# Patient Record
Sex: Male | Born: 1960 | Race: Black or African American | Hispanic: No | Marital: Married | Smoking: Never smoker
Health system: Southern US, Community
[De-identification: ages and names within clinical notes are randomized; demographics above are authoritative.]

## PROBLEM LIST (undated history)

## (undated) DIAGNOSIS — Z95810 Presence of automatic (implantable) cardiac defibrillator: Secondary | ICD-10-CM

## (undated) DIAGNOSIS — K219 Gastro-esophageal reflux disease without esophagitis: Secondary | ICD-10-CM

## (undated) DIAGNOSIS — I5022 Chronic systolic (congestive) heart failure: Secondary | ICD-10-CM

## (undated) DIAGNOSIS — F419 Anxiety disorder, unspecified: Secondary | ICD-10-CM

## (undated) DIAGNOSIS — R319 Hematuria, unspecified: Secondary | ICD-10-CM

## (undated) DIAGNOSIS — I5042 Chronic combined systolic (congestive) and diastolic (congestive) heart failure: Secondary | ICD-10-CM

## (undated) DIAGNOSIS — R011 Cardiac murmur, unspecified: Secondary | ICD-10-CM

## (undated) DIAGNOSIS — I447 Left bundle-branch block, unspecified: Secondary | ICD-10-CM

## (undated) DIAGNOSIS — G4700 Insomnia, unspecified: Secondary | ICD-10-CM

## (undated) DIAGNOSIS — I428 Other cardiomyopathies: Secondary | ICD-10-CM

## (undated) DIAGNOSIS — I10 Essential (primary) hypertension: Secondary | ICD-10-CM

## (undated) HISTORY — DX: Other cardiomyopathies: I42.8

## (undated) HISTORY — DX: Hematuria, unspecified: R31.9

## (undated) HISTORY — DX: Chronic combined systolic (congestive) and diastolic (congestive) heart failure: I50.42

## (undated) HISTORY — DX: Essential (primary) hypertension: I10

## (undated) HISTORY — DX: Presence of automatic (implantable) cardiac defibrillator: Z95.810

## (undated) HISTORY — DX: Left bundle-branch block, unspecified: I44.7

---

## 2008-01-13 ENCOUNTER — Inpatient Hospital Stay (HOSPITAL_BASED_OUTPATIENT_CLINIC_OR_DEPARTMENT_OTHER): Admission: RE | Admit: 2008-01-13 | Discharge: 2008-01-13 | Payer: Self-pay | Admitting: Interventional Cardiology

## 2008-01-13 HISTORY — PX: CARDIAC CATHETERIZATION: SHX172

## 2008-01-16 ENCOUNTER — Encounter: Admission: RE | Admit: 2008-01-16 | Discharge: 2008-01-16 | Payer: Self-pay | Admitting: Interventional Cardiology

## 2011-01-27 NOTE — Cardiovascular Report (Signed)
Jeremy Wagner, Jeremy Wagner                 ACCOUNT NO.:  [id number]   MEDICAL RECORD NO.:  [id number]          PATIENT TYPE:  OIB   LOCATION:  1965                         FACILITY:  [facility]   PHYSICIAN:  Lyn Records, M.D.   DATE OF BIRTH:  1960-10-17   DATE OF PROCEDURE:  01/13/2008  DATE OF DISCHARGE:  01/13/2008                            CARDIAC CATHETERIZATION   INDICATION:  The patient has unexplained left ventricular dysfunction.  A recent Cardiolite study demonstrated perfusion abnormalities.  Study  is being done to exclude coronary artery disease as a source of the  patient's myocardial dysfunction.   PROCEDURES PERFORMED:  1. Left heart catheterization.  2. Selective coronary arteriography.  3. Coronary angiography.   DESCRIPTION:  After informed consent, the patient was brought to the  catheterization laboratory in the postabsorptive state.  He was given 1  mg of IV Versed and 25 mcg of fentanyl IV for sedation.  A 1% Xylocaine  was used for local infiltration in the right femoral region.  A 4-French  sheath was then placed using the modified Seldinger technique following  a single anterior wall stick.  A 4-French A2 multipurpose catheter was  then used for hemodynamic recordings, left ventriculography by hand  injection, and selective right coronary angiography.  We then removed  this catheter and performed left coronary angiography with a #4-French  left Judkins catheter.  The patient tolerated the procedure without  complications.  Manual compression was used to achieve hemostasis.  No  complications occurred.   RESULTS:  1. Hemodynamic data:      a.     Aortic pressure 103/64.      b.     Left ventricular pressure 105/14.  2. Left ventriculography:  The left ventricle appears to be mildly      dilated.  There is global left ventricular hypokinesis.  The      ejection fraction is 35-40%.  No mitral regurgitation is noted.  No      segmental wall motion abnormality is  seen.  There is global      hypokinesis.  3. Coronary angiography:      a.     Left main coronary:  Normal.      b.     Left anterior descending coronary:  Left anterior descending       gives origin to 4 diagonal branches.  The entire left anterior       descending including its diagonals is entirely normal.  The LAD       wraps around the left ventricular apex.      c.     Circumflex artery:  The circumflex coronary artery is large.       It gives origin to 2 predominant obtuse marginal branches.  It is       free of any obstruction.      d.     Right coronary:  The right coronary artery is dominant and       is normal.  It gives origin to PDA and AV-nodal artery.   CONCLUSIONS:  1. Left  ventricular dysfunction with ejection fraction in 35-40%      range.  2. Normal coronary arteries.  3. 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   PLAN:  Aggressive medical therapy partly to include angiotensin-renin  system blockade and autonomic nervous system blockade.  Diuretic therapy  does not appear to be indicated at this time.  We should maximize ACE  and beta-blocker therapy.      Lyn Records, M.D.  Electronically Signed     HWS/MEDQ  D:  01/13/2008  T:  01/13/2008  Job:  [id number]   cc:   Reuben Likes, M.D.

## 2011-10-27 ENCOUNTER — Other Ambulatory Visit: Payer: Self-pay | Admitting: Internal Medicine

## 2011-10-27 DIAGNOSIS — R609 Edema, unspecified: Secondary | ICD-10-CM

## 2011-10-28 ENCOUNTER — Ambulatory Visit
Admission: RE | Admit: 2011-10-28 | Discharge: 2011-10-28 | Disposition: A | Discharge status: Home / Self Care | Payer: BC Managed Care – PPO | Source: Ambulatory Visit | Attending: Internal Medicine | Admitting: Internal Medicine

## 2011-10-28 DIAGNOSIS — R609 Edema, unspecified: Secondary | ICD-10-CM

## 2014-01-17 ENCOUNTER — Encounter: Payer: Self-pay | Admitting: Interventional Cardiology

## 2014-01-24 ENCOUNTER — Telehealth: Payer: Self-pay | Admitting: Interventional Cardiology

## 2014-01-24 NOTE — Telephone Encounter (Signed)
New message           Pt would like to get dr [name] opinion about a surgery before he schedules it for next week

## 2014-01-25 NOTE — Telephone Encounter (Signed)
lmom.Dr.Smithis out of the office until Monday 5/18.pt can return call to discuss further.

## 2014-01-25 NOTE — Telephone Encounter (Signed)
pt sts that he is scheduled to have a colonoscipy on 5/20 with Dr.Hung.pt sts that he is wanting to have Dr.Smith receommendations on being put to sleep.pt is quite anxious.Dr.Hung will be doing the procedure.adv pt that Dr.Smith will return to the office on Monday.pt sts that Dr.Hung office is wanting add info.adv pt I would call their office to see what is needed and call him back with info.pt verbalized understanding.

## 2014-01-25 NOTE — Telephone Encounter (Signed)
per pt rqst.called to see what add info was needed for pt upcoming colonoscopy on 5/20 lmom for Dr.Hung's nurse to call back

## 2014-01-30 NOTE — Telephone Encounter (Signed)
lmtcb on patient's mobile number.

## 2014-01-30 NOTE — Telephone Encounter (Signed)
Spoke to Dr. Michaelle Copas primary CMA to inform.

## 2014-01-30 NOTE — Telephone Encounter (Signed)
New message   Office spoke with patient on the phone .   Colonoscopy is cancel due to patient complaint of chest pain at rest.    patient stated he called the office the make an appt .   Office is aware of message from patient on  5/13.

## 2014-01-30 NOTE — Telephone Encounter (Signed)
New problem   Pt need to speak to nurse concerning a procedure he is needing to have done 02/01/14. Please call pt.

## 2014-01-31 NOTE — Telephone Encounter (Signed)
2nd attempt lmom for pt to callback 

## 2014-01-31 NOTE — Telephone Encounter (Signed)
Follow Up: ° °Pt is returning Lisa's call.  °

## 2014-01-31 NOTE — Telephone Encounter (Signed)
returned pt call.lmom that Dr.Smith was ok with pt having the colonoscopy with general anesthesia.per Dr.Hung office pt cancelled appt because he was having chest pain at rest.left message for pt to cal back if he is still having chest discomfort

## 2014-03-08 ENCOUNTER — Ambulatory Visit: Payer: BC Managed Care – PPO | Admitting: Interventional Cardiology

## 2014-04-10 ENCOUNTER — Telehealth: Payer: Self-pay | Admitting: Interventional Cardiology

## 2014-04-10 NOTE — Telephone Encounter (Signed)
New problem   Pt need medication refill and Mindy stated pt hasn't been seen in office and message need to go to nurse. Please call pt.

## 2014-04-10 NOTE — Telephone Encounter (Signed)
F/u    Pt need his meds called in because he doesn't have any. Please call pt or call pharmacy [facility], Carvedilol 6.25mg .

## 2014-04-11 ENCOUNTER — Other Ambulatory Visit: Payer: Self-pay

## 2014-04-11 MED ORDER — CARVEDILOL 6.25 MG PO TABS: 6.2500 mg | ORAL_TABLET | Freq: Two times a day (BID) | ORAL | Status: DC

## 2014-04-11 NOTE — Telephone Encounter (Signed)
Cardvedilol 6.25mg  bid refilled

## 2014-04-16 ENCOUNTER — Encounter: Payer: Self-pay | Admitting: General Surgery

## 2014-04-16 ENCOUNTER — Encounter: Payer: Self-pay | Admitting: Interventional Cardiology

## 2014-04-16 ENCOUNTER — Ambulatory Visit (INDEPENDENT_AMBULATORY_CARE_PROVIDER_SITE_OTHER): Payer: BC Managed Care – PPO | Admitting: Interventional Cardiology

## 2014-04-16 VITALS — BP 137/85 | HR 60 | Ht 69.0 in | Wt 165.4 lb

## 2014-04-16 DIAGNOSIS — R0789 Other chest pain: Secondary | ICD-10-CM

## 2014-04-16 DIAGNOSIS — R0782 Intercostal pain: Secondary | ICD-10-CM

## 2014-04-16 DIAGNOSIS — I130 Hypertensive heart and chronic kidney disease with heart failure and stage 1 through stage 4 chronic kidney disease, or unspecified chronic kidney disease: Secondary | ICD-10-CM | POA: Insufficient documentation

## 2014-04-16 DIAGNOSIS — I5032 Chronic diastolic (congestive) heart failure: Secondary | ICD-10-CM

## 2014-04-16 DIAGNOSIS — I428 Other cardiomyopathies: Secondary | ICD-10-CM

## 2014-04-16 DIAGNOSIS — I10 Essential (primary) hypertension: Secondary | ICD-10-CM

## 2014-04-16 DIAGNOSIS — I447 Left bundle-branch block, unspecified: Secondary | ICD-10-CM | POA: Insufficient documentation

## 2014-04-16 DIAGNOSIS — I5042 Chronic combined systolic (congestive) and diastolic (congestive) heart failure: Secondary | ICD-10-CM | POA: Insufficient documentation

## 2014-04-16 NOTE — Patient Instructions (Addendum)
Your physician recommends that you continue on your current medications as directed. Please refer to the Current Medication list given to you today. Restart Lisinopril 5 Mg daily  Continue with Low sodium Diet  Your physician wants you to follow-up in: 1 year with Dr Marlou Starks will receive a reminder letter in the mail two months in advance. If you don't receive a letter, please call our office to schedule the follow-up appointment.

## 2014-04-16 NOTE — Progress Notes (Signed)
Patient ID: DAT BJORKMAN, male   DOB: May 20, 1961, 53 y.o.   MRN: [id number]    [address] [place], [place]  [address] Phone: [phone] Fax:  [phone]  Date:  04/16/2014   ID:  HASSELL BERNAL, DOB June 08, 1961, MRN [id number]  PCP:  Georgann Housekeeper, MD   ASSESSMENT:  1. Hypertension, controlled 2. Left bundle branch block 3. Chronic combined systolic and diastolic heart failure 4. Lower left and right subcostal pain felt to represent musculoskeletal discomfort  PLAN:  1. Resume lisinopril which the patient has stopped taking 2. Low salt diet 3. No specific cardiac workup at this time to consider 2-D Doppler echocardiogram on return in one year 4. It is okay to proceed with colonoscopy to   SUBJECTIVE: Yael B Stockley is a 53 y.o. male who is asymptomatic. 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 His weight is been stable. He denies orthopnea and PND. For some reason he has not taken lisinopril for a week. He has mild his blood pressure and states that the systolic is less than 120 when he measures it. Greater than a year ago we decreased lisinopril to 5 mg per day because of hypotension. He denies dizziness and lightheadedness as a reason for a voiding lisinopril. He denies cough. He canceled a recent colonoscopy because of subcostal discomfort. This is been a chronic complaint over many years. It is nonexertional and it has not changed over that time frame.  Wt Readings from Last 3 Encounters:  04/16/14 165 lb 6.4 oz (75.025 kg)     Past Medical History  Diagnosis Date  . Nonischemic cardiomyopathy     LVEF 35-40% by cath 01-13-08. Normal coronary arteries  . Hypertension   . LBBB (left bundle branch block)   . Chronic diastolic heart failure   . Reflux   . Hematuria     Current Outpatient Prescriptions  Medication Sig Dispense Refill  . carvedilol (COREG) 6.25 MG tablet Take 1 tablet (6.25 mg total) by mouth 2 (two) times daily.  60 tablet  0  .  lisinopril (PRINIVIL,ZESTRIL) 10 MG tablet Take 5 mg by mouth daily.       No current facility-administered medications for this visit.    Allergies:   No Known Allergies  Social History:  The patient  reports that he has never smoked. He does not have any smokeless tobacco history on file. He reports that he does not drink alcohol or use illicit drugs.   ROS:  Please see the history of present illness.   No edema or claudication. Denies PND and orthopnea. No palpitations or neurological complaint   All other systems reviewed and negative.   OBJECTIVE: VS:  BP 137/85  Pulse 60  Ht 5\' 9"  (1.753 m)  Wt 165 lb 6.4 oz (75.025 kg)  BMI 24.41 kg/m2 Well nourished, well developed, in no acute distress, appears than stated a HEENT: normal Neck: JVD flat. Carotid bruit absent  Cardiac:  normal S1, S2; RRR; no murmur. An S4 gallop is on Lungs:  clear to auscultation bilaterally, no wheezing, rhonchi or rales Abd: soft, nontender, no hepatomegaly Ext: Edema absent. Pulses 2+ and symmetric Skin: warm and dry Neuro:  CNs 2-12 intact, no focal abnormalities noted  EKG:  Normal sinus rhythm with left bundle branch block and normal PR interval, unchanged from prior. QRS duration 156 ms       Signed, Darci Needle [name], MD 04/16/2014 10:28 AM

## 2014-05-23 ENCOUNTER — Other Ambulatory Visit: Payer: Self-pay

## 2014-05-23 MED ORDER — LISINOPRIL 10 MG PO TABS: 5.0000 mg | ORAL_TABLET | Freq: Every day | ORAL | Status: DC

## 2014-06-18 ENCOUNTER — Other Ambulatory Visit: Payer: Self-pay | Admitting: Interventional Cardiology

## 2014-10-09 ENCOUNTER — Encounter (HOSPITAL_COMMUNITY): Payer: Self-pay | Admitting: Emergency Medicine

## 2014-10-09 ENCOUNTER — Observation Stay (HOSPITAL_COMMUNITY)
Admission: EM | Admit: 2014-10-09 | Discharge: 2014-10-10 | Disposition: A | Discharge status: Home / Self Care | Payer: BC Managed Care – PPO | Attending: Internal Medicine | Admitting: Internal Medicine

## 2014-10-09 ENCOUNTER — Emergency Department (HOSPITAL_COMMUNITY): Payer: BC Managed Care – PPO

## 2014-10-09 ENCOUNTER — Telehealth: Payer: Self-pay | Admitting: Interventional Cardiology

## 2014-10-09 DIAGNOSIS — I428 Other cardiomyopathies: Secondary | ICD-10-CM

## 2014-10-09 DIAGNOSIS — I429 Cardiomyopathy, unspecified: Secondary | ICD-10-CM | POA: Diagnosis not present

## 2014-10-09 DIAGNOSIS — K219 Gastro-esophageal reflux disease without esophagitis: Secondary | ICD-10-CM | POA: Insufficient documentation

## 2014-10-09 DIAGNOSIS — I447 Left bundle-branch block, unspecified: Secondary | ICD-10-CM | POA: Diagnosis not present

## 2014-10-09 DIAGNOSIS — Z7982 Long term (current) use of aspirin: Secondary | ICD-10-CM | POA: Diagnosis not present

## 2014-10-09 DIAGNOSIS — R001 Bradycardia, unspecified: Secondary | ICD-10-CM | POA: Insufficient documentation

## 2014-10-09 DIAGNOSIS — F419 Anxiety disorder, unspecified: Secondary | ICD-10-CM | POA: Diagnosis not present

## 2014-10-09 DIAGNOSIS — R079 Chest pain, unspecified: Principal | ICD-10-CM | POA: Diagnosis present

## 2014-10-09 DIAGNOSIS — Z9119 Patient's noncompliance with other medical treatment and regimen: Secondary | ICD-10-CM | POA: Insufficient documentation

## 2014-10-09 DIAGNOSIS — I10 Essential (primary) hypertension: Secondary | ICD-10-CM | POA: Insufficient documentation

## 2014-10-09 DIAGNOSIS — I130 Hypertensive heart and chronic kidney disease with heart failure and stage 1 through stage 4 chronic kidney disease, or unspecified chronic kidney disease: Secondary | ICD-10-CM | POA: Diagnosis present

## 2014-10-09 DIAGNOSIS — I5032 Chronic diastolic (congestive) heart failure: Secondary | ICD-10-CM

## 2014-10-09 DIAGNOSIS — I5042 Chronic combined systolic (congestive) and diastolic (congestive) heart failure: Secondary | ICD-10-CM | POA: Diagnosis not present

## 2014-10-09 DIAGNOSIS — G4700 Insomnia, unspecified: Secondary | ICD-10-CM | POA: Diagnosis not present

## 2014-10-09 DIAGNOSIS — I5022 Chronic systolic (congestive) heart failure: Secondary | ICD-10-CM | POA: Insufficient documentation

## 2014-10-09 DIAGNOSIS — R319 Hematuria, unspecified: Secondary | ICD-10-CM

## 2014-10-09 DIAGNOSIS — R072 Precordial pain: Secondary | ICD-10-CM

## 2014-10-09 DIAGNOSIS — IMO0001 Reserved for inherently not codable concepts without codable children: Secondary | ICD-10-CM | POA: Insufficient documentation

## 2014-10-09 HISTORY — DX: Insomnia, unspecified: G47.00

## 2014-10-09 HISTORY — DX: Anxiety disorder, unspecified: F41.9

## 2014-10-09 LAB — CBC
HCT: 39.7 % (ref 39.0–52.0)
Hemoglobin: 13.2 g/dL (ref 13.0–17.0)
MCH: 31.6 pg (ref 26.0–34.0)
MCHC: 33.2 g/dL (ref 30.0–36.0)
MCV: 95 fL (ref 78.0–100.0)
Platelets: 184 10*3/uL (ref 150–400)
RBC: 4.18 MIL/uL — ABNORMAL LOW (ref 4.22–5.81)
RDW: 11.7 % (ref 11.5–15.5)
WBC: 6.5 10*3/uL (ref 4.0–10.5)

## 2014-10-09 LAB — CBC WITH DIFFERENTIAL/PLATELET
Basophils Absolute: 0 10*3/uL (ref 0.0–0.1)
Basophils Relative: 0 % (ref 0–1)
Eosinophils Absolute: 0.1 10*3/uL (ref 0.0–0.7)
Eosinophils Relative: 2 % (ref 0–5)
HCT: 41.4 % (ref 39.0–52.0)
Hemoglobin: 14 g/dL (ref 13.0–17.0)
Lymphocytes Relative: 25 % (ref 12–46)
Lymphs Abs: 1.8 10*3/uL (ref 0.7–4.0)
MCH: 32 pg (ref 26.0–34.0)
MCHC: 33.8 g/dL (ref 30.0–36.0)
MCV: 94.5 fL (ref 78.0–100.0)
Monocytes Absolute: 0.6 10*3/uL (ref 0.1–1.0)
Monocytes Relative: 8 % (ref 3–12)
Neutro Abs: 4.8 10*3/uL (ref 1.7–7.7)
Neutrophils Relative %: 65 % (ref 43–77)
Platelets: 190 10*3/uL (ref 150–400)
RBC: 4.38 MIL/uL (ref 4.22–5.81)
RDW: 11.6 % (ref 11.5–15.5)
WBC: 7.4 10*3/uL (ref 4.0–10.5)

## 2014-10-09 LAB — COMPREHENSIVE METABOLIC PANEL
ALT: 23 U/L (ref 0–53)
AST: 29 U/L (ref 0–37)
Albumin: 3.9 g/dL (ref 3.5–5.2)
Alkaline Phosphatase: 67 U/L (ref 39–117)
Anion gap: 7 (ref 5–15)
BUN: 11 mg/dL (ref 6–23)
CO2: 28 mmol/L (ref 19–32)
Calcium: 9.3 mg/dL (ref 8.4–10.5)
Chloride: 102 mmol/L (ref 96–112)
Creatinine, Ser: 1.11 mg/dL (ref 0.50–1.35)
GFR calc Af Amer: 85 mL/min — ABNORMAL LOW (ref >90)
GFR calc non Af Amer: 74 mL/min — ABNORMAL LOW (ref >90)
Glucose, Bld: 99 mg/dL (ref 70–99)
Potassium: 4.1 mmol/L (ref 3.5–5.1)
Sodium: 137 mmol/L (ref 135–145)
Total Bilirubin: 1.1 mg/dL (ref 0.3–1.2)
Total Protein: 7.5 g/dL (ref 6.0–8.3)

## 2014-10-09 LAB — TROPONIN I
Troponin I: <0.03 ng/mL (ref <0.031)
Troponin I: <0.03 ng/mL (ref <0.031)
Troponin I: <0.03 ng/mL (ref <0.031)

## 2014-10-09 LAB — CREATININE, SERUM
Creatinine, Ser: 1.02 mg/dL (ref 0.50–1.35)
GFR calc Af Amer: >90 mL/min (ref >90)
GFR calc non Af Amer: 81 mL/min — ABNORMAL LOW (ref >90)

## 2014-10-09 MED ORDER — ASPIRIN EC 325 MG PO TBEC
325.0000 mg | DELAYED_RELEASE_TABLET | Freq: Every day | ORAL | Status: DC
  Administered 2014-10-10: 325 mg via ORAL
  Filled 2014-10-09: qty 1

## 2014-10-09 MED ORDER — ENOXAPARIN SODIUM 40 MG/0.4ML ~~LOC~~ SOLN
40.0000 mg | SUBCUTANEOUS | Status: DC
  Administered 2014-10-09: 40 mg via SUBCUTANEOUS
  Filled 2014-10-09 (×2): qty 0.4

## 2014-10-09 MED ORDER — NITROGLYCERIN 0.4 MG SL SUBL
0.4000 mg | SUBLINGUAL_TABLET | SUBLINGUAL | Status: DC | PRN
  Administered 2014-10-09: 0.4 mg via SUBLINGUAL
  Filled 2014-10-09: qty 1

## 2014-10-09 MED ORDER — CARVEDILOL 3.125 MG PO TABS
3.1250 mg | ORAL_TABLET | Freq: Two times a day (BID) | ORAL | Status: DC
  Administered 2014-10-10 (×2): 3.125 mg via ORAL
  Filled 2014-10-09 (×4): qty 1

## 2014-10-09 MED ORDER — ACETAMINOPHEN 325 MG PO TABS: 650.0000 mg | ORAL_TABLET | ORAL | Status: DC | PRN

## 2014-10-09 MED ORDER — ONDANSETRON HCL 4 MG/2ML IJ SOLN: 4.0000 mg | Freq: Four times a day (QID) | INTRAMUSCULAR | Status: DC | PRN

## 2014-10-09 NOTE — Consult Note (Addendum)
CARDIOLOGY CONSULTATION NOTE.  NAME:  Jeremy Wagner   MRN: [id number] DOB:  15-Oct-1960   ADMIT DATE: 10/09/2014  Reason for Consult: Acute onset Chest Pain (prior h/o NICM)  Requesting Physician: Dr. Mahala Menghini  Primary Cardiologist: Dr. Sherilyn Cooter (Erskine Emery) [name], [name]  HPI: This is a 54 y.o. male with a past medical history significant for NICM (angiographically normal coronaries with EF ~35-40% by cath in 01/2008). He was seen last fall by Dr. [name], he is noted to have a history of medical nonadherence. He is supposed to be on low-dose carvedilol and low-dose ACE inhibitor, but he has not been taking his carvedilol routinely nor his ACE inhibitor. 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 He was feeling tired and fatigued. Feeling overworked  He has been drinking increasing amounts of coffee as well. Besides the sense of stress and fatigue from hard work, he had been doing fine until this morning, shortly after having his coffee he noted a sense of uneasiness with nausea. This was in conjunction with a general feeling of worsening fatigue with a heaviness across his chest. It lasted about 10 minutes at a significant level of roughly 7-8/10. He did take some chewable aspirin this morning and noticed some relief. He did not really get rapid relief from sublingual nitroglycerin given by EMS or in the ER. He was noted to be hypertensive when checked at work: Blood pressure 177/90. It did go down after nitroglycerin. His wife is extremely concerned, as was his boss and coworkers. She insisted that he call Dr. Michaelle Copas office.  He was appropriately referred to present to the emergency room for evaluation. He is being admitted by the  [facility] with cardiology consultation. An echocardiogram has been ordered, but not yet done.  Past Medical History    Diagnosis Date  . Nonischemic cardiomyopathy     LVEF 35-40% by cath 01-13-08. Normal coronary arteries  . Hypertension   . LBBB (left bundle branch block)   . Chronic diastolic heart failure   . Reflux   . Hematuria   . Anxiety   . Insomnia    Past Surgical History  Procedure Laterality Date  . Cardiac catheterization  01/2008    clean Coronaries, HTN CM??; EF ~30-35%    FAMHx: Family History  Problem Relation Age of Onset  . CAD Maternal Aunt 50  . Coronary artery disease Paternal Uncle 46    SOCHx:  reports that he has never smoked. He does not have any smokeless tobacco history on file. He reports that he does not drink alcohol or use illicit drugs.  ALLERGIES: No Known Allergies  ROS: A comprehensive review of systems was performed. Review of Systems  Constitutional: Negative for fever, chills and malaise/fatigue.  HENT: Negative for congestion and nosebleeds.   Eyes: Negative for blurred vision and double vision.  Respiratory: Negative for cough and shortness of breath.   Cardiovascular: Positive for chest pain. Negative for palpitations, orthopnea, claudication, leg swelling and PND.  Gastrointestinal: Negative for blood in stool and melena.  Genitourinary: Negative for hematuria.  Musculoskeletal:       Mild routine aches and pains  Neurological: Positive for dizziness (tthis morning  with high pressures) and headaches (This morning). Negative for focal weakness.  Psychiatric/Behavioral:       Under significant stress with long hours  and difficult work -- was working on road treatment status post snow storm  All other systems reviewed and are negative.   HOME MEDICATIONS: No current facility-administered medications on file prior to encounter.   Current Outpatient Prescriptions on File Prior to Encounter  Medication Sig Dispense Refill  . carvedilol (COREG) 6.25 MG tablet TAKE 1 TABLET (6.25 MG TOTAL) BY MOUTH 2 (TWO) TIMES DAILY. 60 tablet 5  . lisinopril  (PRINIVIL,ZESTRIL) 10 MG tablet Take 0.5 tablets (5 mg total) by mouth daily. (Patient not taking: Reported on 10/09/2014) 30 tablet 5  Not Taking   VITALS: Blood pressure 129/84, pulse 52, resp. rate 19, SpO2 100 %.  PHYSICAL EXAM: Physical Exam  Constitutional: He is oriented to person, place, and time. He appears well-developed and well-nourished. No distress.  HENT:  Head: Normocephalic and atraumatic.  Eyes: Conjunctivae and EOM are normal. Pupils are equal, round, and reactive to light. No scleral icterus.  Neck: Normal range of motion. Neck supple.  Cardiovascular: Normal rate, regular rhythm, S1 normal, S2 normal and normal pulses.  PMI is not displaced.  Exam reveals gallop and S4. Exam reveals no friction rub, no midsystolic click and no opening snap.   No murmur heard. Pulmonary/Chest: Effort normal and breath sounds normal. No respiratory distress. He has no wheezes. He has no rales.  Abdominal: Soft. Bowel sounds are normal. He exhibits no distension and no mass. There is no tenderness. There is no rebound and no guarding.  Genitourinary:  deferred  Musculoskeletal: Normal range of motion. He exhibits no edema.  Neurological: He is alert and oriented to person, place, and time. No cranial nerve deficit.  Skin: Skin is warm and dry. No rash noted. He is not diaphoretic.  Psychiatric: He has a normal mood and affect. His behavior is normal. Judgment and thought content normal.    LABS: Results for orders placed or performed during the hospital encounter of 10/09/14 (from the past 24 hour(s))  Troponin I     Status: None   Collection Time: 10/09/14 10:38 AM  Result Value Ref Range   Troponin I <0.03 <0.031 ng/mL  CBC with Differential/Platelet     Status: None   Collection Time: 10/09/14 10:38 AM  Result Value Ref Range   WBC 7.4 4.0 - 10.5 K/uL   RBC 4.38 4.22 - 5.81 MIL/uL   Hemoglobin 14.0 13.0 - 17.0 g/dL   HCT 16.1 09.6 - 04.5 %   MCV 94.5 78.0 - 100.0 fL   MCH  32.0 26.0 - 34.0 pg   MCHC 33.8 30.0 - 36.0 g/dL   RDW 40.9 81.1 - 91.4 %   Platelets 190 150 - 400 K/uL   Neutrophils Relative % 65 43 - 77 %   Neutro Abs 4.8 1.7 - 7.7 K/uL   Lymphocytes Relative 25 12 - 46 %   Lymphs Abs 1.8 0.7 - 4.0 K/uL   Monocytes Relative 8 3 - 12 %   Monocytes Absolute 0.6 0.1 - 1.0 K/uL   Eosinophils Relative 2 0 - 5 %   Eosinophils Absolute 0.1 0.0 - 0.7 K/uL   Basophils Relative 0 0 - 1 %   Basophils Absolute 0.0 0.0 - 0.1 K/uL  Comprehensive metabolic panel     Status: Abnormal   Collection Time: 10/09/14 10:38 AM  Result Value Ref Range   Sodium 137 135 - 145 mmol/L   Potassium 4.1 3.5 - 5.1 mmol/L   Chloride 102 96 - 112 mmol/L   CO2 28  19 - 32 mmol/L   Glucose, Bld 99 70 - 99 mg/dL   BUN 11 6 - 23 mg/dL   Creatinine, Ser 1.02 0.50 - 1.35 mg/dL   Calcium 9.3 8.4 - 72.5 mg/dL   Total Protein 7.5 6.0 - 8.3 g/dL   Albumin 3.9 3.5 - 5.2 g/dL   AST 29 0 - 37 U/L   ALT 23 0 - 53 U/L   Alkaline Phosphatase 67 39 - 117 U/L   Total Bilirubin 1.1 0.3 - 1.2 mg/dL   GFR calc non Af Amer 74 (L) >90 mL/min   GFR calc Af Amer 85 (L) >90 mL/min   Anion gap 7 5 - 15    IMAGING: No results found.  IMPRESSION: Principal Problem:   Chest pain with moderate risk for cardiac etiology Active Problems:   Chronic combined systolic and diastolic CHF, NYHA class 1   Nonischemic cardiomyopathy   Essential hypertension, benign   LBBB (left bundle branch block)   Anxiety   Insomnia  My overall impression is that his symptoms are likely to simply related to her work and fatigue leading to hypertension. He might have some microvascular-related anginal symptoms. He has not had any ischemic findings in the past, but his last catheterization was 7 years ago. He has not had either a stress test or an echocardiogram. He carries a diagnosis of chronic combined heart failure, but no evaluation of late. This could simply have been diastolic dysfunction related demand  ischemia. The presence of multiple reddish block makes diagnostic differential impossible.  RECOMMENDATION: 1. Chest pain - with prior h/o angiographically normal coronaries, ischemic CP is less likely.  Not relieved by NTG.  ASA relief in non-specific as inflammatory pain is also helped.  Unfortunately, his EKG is abnormal with LBBB -- not helpful diagnostically.  Initial Troponin levels are Negative  Cycle cardiac markers - agree with no Heparin unless Rules In  Has not had cardiac evaluation documented in some time - Echo would indeed be helpful for EF & LV Filling pressures  If Negative Biomarkers - would plan for Myoview in AM to confirm non-ischemic pain; Very low likelihood that he has developed significant LM CAD since 200. 2. ~HTN - agree with lower dose of BB with HR in 50s, was intended to be on ACE-I - would restart.   Time Spent Directly with Patient: 35 minutes  Moishy Laday, Piedad Climes, M.D., M.S. Interventional Cardiologist   Pager # [phone]

## 2014-10-09 NOTE — ED Provider Notes (Signed)
CSN: [id number]     Arrival date & time 10/09/14  1022 History   First MD Initiated Contact with Patient 10/09/14 1037     Chief Complaint  Patient presents with  . Chest Pain     (Consider location/radiation/quality/duration/timing/severity/associated sxs/prior Treatment) HPI Comments: Patient with sudden onset of substernal chest heaviness and chest pain which began at work today. Patient was standing when his symptoms began and they were associated with dyspnea. Pain did not radiate to his arms or to his neck. There is no associated diaphoresis. Similar episode about 2 weeks ago which there was no evaluation. He took baby aspirin and did feel somewhat better. Symptoms lasted for approximately 2 hours. Called his doctor and was told to come to the hospital. Does have a history of nonischemic cardiomyopathy with left ventricular ejection fraction of 35-40% basement catheterization done in May 2009. No recent orthopnea or dyspnea on exertion. He is currently pain-free  Patient is a 54 y.o. male presenting with chest pain. The history is provided by the patient and the spouse.  Chest Pain   Past Medical History  Diagnosis Date  . Nonischemic cardiomyopathy     LVEF 35-40% by cath 01-13-08. Normal coronary arteries  . Hypertension   . LBBB (left bundle branch block)   . Chronic diastolic heart failure   . Reflux   . Hematuria    Past Surgical History  Procedure Laterality Date  . Cardiac catheterization     History reviewed. No pertinent family history. History  Substance Use Topics  . Smoking status: Never Smoker   . Smokeless tobacco: Not on file  . Alcohol Use: No    Review of Systems  Cardiovascular: Positive for chest pain.  All other systems reviewed and are negative.     Allergies  Review of patient's allergies indicates no known allergies.  Home Medications   Prior to Admission medications   Medication Sig Start Date End Date Taking? Authorizing Provider   carvedilol (COREG) 6.25 MG tablet TAKE 1 TABLET (6.25 MG TOTAL) BY MOUTH 2 (TWO) TIMES DAILY. 06/19/14   Lyn Records [name], MD  lisinopril (PRINIVIL,ZESTRIL) 10 MG tablet Take 0.5 tablets (5 mg total) by mouth daily. 05/23/14   Lyn Records [name], MD   BP 136/85 mmHg  Pulse 60  Resp 18  SpO2 97% Physical Exam  Constitutional: He is oriented to person, place, and time. He appears well-developed and well-nourished.  Non-toxic appearance. No distress.  HENT:  Head: Normocephalic and atraumatic.  Eyes: Conjunctivae, EOM and lids are normal. Pupils are equal, round, and reactive to light.  Neck: Normal range of motion. Neck supple. No tracheal deviation present. No thyroid mass present.  Cardiovascular: Normal rate, regular rhythm and normal heart sounds.  Exam reveals no gallop.   No murmur heard. Pulmonary/Chest: Effort normal and breath sounds normal. No stridor. No respiratory distress. He has no decreased breath sounds. He has no wheezes. He has no rhonchi. He has no rales.  Abdominal: Soft. Normal appearance and bowel sounds are normal. He exhibits no distension. There is no tenderness. There is no rebound and no CVA tenderness.  Musculoskeletal: Normal range of motion. He exhibits no edema or tenderness.  Neurological: He is alert and oriented to person, place, and time. He has normal strength. No cranial nerve deficit or sensory deficit. GCS eye subscore is 4. GCS verbal subscore is 5. GCS motor subscore is 6.  Skin: Skin is warm and dry. No abrasion and no rash  noted.  Psychiatric: He has a normal mood and affect. His speech is normal and behavior is normal.  Nursing note and vitals reviewed.   ED Course  Procedures (including critical care time) Labs Review Labs Reviewed  CBC WITH DIFFERENTIAL/PLATELET  TROPONIN I  COMPREHENSIVE METABOLIC PANEL    Imaging Review No results found.   EKG Interpretation   Date/Time:  Tuesday October 09 2014 10:31:31 EST Ventricular Rate:   66 PR Interval:  172 QRS Duration: 164 QT Interval:  435 QTC Calculation: 456 R Axis:   -97 Text Interpretation:  Sinus rhythm Left bundle branch block Confirmed by  Dillan Lunden  MD, Markelle Najarian ([id number]) on 10/09/2014 10:37:25 AM      MDM   Final diagnoses:  Chest pain   Patient will be admitted for evaluation of his chest pain. He is currently chest pain-free and had aspirin prior to arrival     Toy Baker, MD 10/09/14 1230

## 2014-10-09 NOTE — Telephone Encounter (Signed)
Spoke with wife and she asked pt questions as I asked them. She states that pt's chest pain has been occuring for about an hour. States that the pain does come and go and there is tightness and pressure.  Some slight SOB, no nausea or vomiting, no swelling. States pt does not have Nitro. Wife states that pt does not weigh himself regularly but he has not noticed any significant weight changes the last few times he has weighed.  Spoke with Dr. [name] and made him aware of pt's complaints and he advised that pt go to ER. Informed wife of this advisement and she verbalized understanding and was in agreement with this plan.
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
## 2014-10-09 NOTE — Progress Notes (Addendum)
[facility] ED CM spoke with Dr Mahala Menghini about admission Order provided

## 2014-10-09 NOTE — ED Notes (Signed)
Meal Tray Provided.

## 2014-10-09 NOTE — ED Notes (Signed)
Pt states left sided chest pain started around 0730 with SOB. Pt states took 3 baby aspirin with some relief. Pt denies N/V/D.

## 2014-10-09 NOTE — ED Notes (Signed)
Family at bedside. Pt currently denies pain. Pt finished meal and has no further needs at this time.

## 2014-10-09 NOTE — Telephone Encounter (Signed)
New Message  Pt c/o of Chest Pain: STAT if CP now or developed within 24 hours  1. Are you having CP right now? Mild pain right now  2. Are you experiencing any other symptoms (ex. SOB, nausea, vomiting, sweating)?   3. How long have you been experiencing CP? For about an hour  4. Is your CP continuous or coming and going? Comes and goes  5. Have you taken Nitroglycerin? Pt does not have nitro.  ?

## 2014-10-09 NOTE — ED Notes (Signed)
Spoke with Jessica CN on 4 W. 20 minutes start at [employer]

## 2014-10-09 NOTE — ED Notes (Addendum)
Cardiology at bedside.

## 2014-10-10 ENCOUNTER — Other Ambulatory Visit: Payer: Self-pay

## 2014-10-10 DIAGNOSIS — I5042 Chronic combined systolic (congestive) and diastolic (congestive) heart failure: Secondary | ICD-10-CM | POA: Diagnosis not present

## 2014-10-10 DIAGNOSIS — I10 Essential (primary) hypertension: Secondary | ICD-10-CM | POA: Diagnosis not present

## 2014-10-10 DIAGNOSIS — I447 Left bundle-branch block, unspecified: Secondary | ICD-10-CM | POA: Diagnosis not present

## 2014-10-10 DIAGNOSIS — I429 Cardiomyopathy, unspecified: Secondary | ICD-10-CM

## 2014-10-10 DIAGNOSIS — R079 Chest pain, unspecified: Secondary | ICD-10-CM | POA: Diagnosis not present

## 2014-10-10 DIAGNOSIS — R072 Precordial pain: Secondary | ICD-10-CM

## 2014-10-10 MED ORDER — LISINOPRIL 20 MG PO TABS: 10.0000 mg | ORAL_TABLET | Freq: Every day | ORAL | Status: DC

## 2014-10-10 MED ORDER — ASPIRIN EC 81 MG PO TBEC: 81.0000 mg | DELAYED_RELEASE_TABLET | Freq: Every day | ORAL | Status: AC

## 2014-10-10 MED ORDER — NITROGLYCERIN 0.4 MG SL SUBL: 0.4000 mg | SUBLINGUAL_TABLET | SUBLINGUAL | Status: DC | PRN

## 2014-10-10 MED ORDER — CARVEDILOL 6.25 MG PO TABS: 3.1250 mg | ORAL_TABLET | Freq: Two times a day (BID) | ORAL | Status: DC

## 2014-10-10 NOTE — Discharge Instructions (Signed)

## 2014-10-10 NOTE — Progress Notes (Signed)
Echocardiogram 2D Echocardiogram has been performed.  Jeremy Wagner 10/10/2014, 10:40 AM

## 2014-10-10 NOTE — Progress Notes (Signed)
    Subjective:  No CP. Doing well.   Objective:  Vital Signs in the last 24 hours: Temp:  [98 F (36.7 C)-98.3 F (36.8 C)] 98.1 F (36.7 C) (01/27 0507) Pulse Rate:  [52-77] 77 (01/27 0507) Resp:  [16-22] 20 (01/27 0507) BP: (105-136)/(57-86) 107/69 mmHg (01/27 0507) SpO2:  [95 %-100 %] 100 % (01/27 0507) Weight:  [161 lb 2.5 oz (73.1 kg)] 161 lb 2.5 oz (73.1 kg) (01/26 1808)  Intake/Output from previous day: 01/26 0701 - 01/27 0700 In: 480 [P.O.:480] Out: -    Physical Exam: General: Well developed, well nourished, in no acute distress. Head:  Normocephalic and atraumatic. Lungs: Clear to auscultation and percussion. Heart: Normal S1 and S2.  No murmur, rubs or gallops.  Abdomen: soft, non-tender, positive bowel sounds. Extremities: No clubbing or cyanosis. No edema. Neurologic: Alert and oriented x 3.    Lab Results:  Recent Labs  10/09/14 1038 10/09/14 1641  WBC 7.4 6.5  HGB 14.0 13.2  PLT 190 184    Recent Labs  10/09/14 1038 10/09/14 1641  NA 137  --   K 4.1  --   CL 102  --   CO2 28  --   GLUCOSE 99  --   BUN 11  --   CREATININE 1.11 1.02    Recent Labs  10/09/14 1641 10/09/14 2255  TROPONINI <0.03 <0.03   Hepatic Function Panel  Recent Labs  10/09/14 1038  PROT 7.5  ALBUMIN 3.9  AST 29  ALT 23  ALKPHOS 67  BILITOT 1.1  Imaging: Dg Chest 2 View  10/09/2014   CLINICAL DATA:  Chest pain .  EXAM: CHEST  2 VIEW  COMPARISON:  None.  FINDINGS: Mediastinum and hilar structures are normal. Lungs are clear. Heart size normal. No pleural effusion or pneumothorax. Tiny calcified pulmonary nodules noted bilaterally consistent with granulomas. No acute bony abnormality. Degenerative changes thoracic spine.  IMPRESSION: No acute cardiopulmonary disease. Evidence of prior hip granulomatous disease.   Electronically Signed   By: Maisie Fus  Register   On: 10/09/2014 11:48   Personally viewed.   Telemetry: Sbrady 40-50 at night Personally viewed.     Assessment/Plan:  Principal Problem:   Chest pain with moderate risk for cardiac etiology Active Problems:   Chronic combined systolic and diastolic CHF, NYHA class 1   Essential hypertension, benign   LBBB (left bundle branch block)   Nonischemic cardiomyopathy   Anxiety   Insomnia  CP  - Dr. Elissa Hefty note reviewed  - Since trop is normal, OK with DC home with outpatient stress NUC. Will set up.   - Pharm stress with LBBB  - discussed with family and patient. OK with plan.   - ASA  Bradycardia  - Asymptomatic  - OK with low dose coreg as he is on.   NICM  - 40% EF by cath 2009  - Start ACE-I as outpatient if BP allows. (Has not been compliant with meds in the past.   Follow up with Dr. Mendel Ryder.    OK for DC  Donato Schultz, MD   Anne Fu, Jacelyn Cuen 10/10/2014, 8:03 AM
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
## 2014-10-10 NOTE — Progress Notes (Signed)
Discharge information gone over with pt.  All questions answered.  VSS.  Prescriptions given.  Pt will be wheeled out by NT.

## 2014-10-10 NOTE — Progress Notes (Signed)
UR completed 

## 2014-10-17 ENCOUNTER — Telehealth: Payer: Self-pay | Admitting: Cardiology

## 2014-10-17 DIAGNOSIS — R079 Chest pain, unspecified: Secondary | ICD-10-CM

## 2014-10-17 NOTE — Telephone Encounter (Signed)
Order placed for stress testing - pt call the pt to schedule appts as requested.

## 2014-10-17 NOTE — Telephone Encounter (Signed)
New Message  Per staff message from [facility] ----Can you please have the team arrange an outpt nuc stress pharm study and then the follow up with Dr [name]? Dr Anne Fu is requesting this. I Can schedule the Follow up will need orders for the nuc stess. Per Trish Dr. Anne Fu has to put in orders. Please assist

## 2014-10-22 ENCOUNTER — Other Ambulatory Visit: Payer: Self-pay

## 2014-10-22 MED ORDER — CARVEDILOL 6.25 MG PO TABS: 3.1250 mg | ORAL_TABLET | Freq: Two times a day (BID) | ORAL | Status: DC

## 2014-10-22 MED ORDER — LISINOPRIL 20 MG PO TABS: 10.0000 mg | ORAL_TABLET | Freq: Every day | ORAL | Status: DC

## 2014-10-22 MED ORDER — LISINOPRIL 20 MG PO TABS
10.0000 mg | ORAL_TABLET | Freq: Every day | ORAL | Status: DC
Start: 2014-10-22 — End: 2014-10-22

## 2014-10-26 ENCOUNTER — Ambulatory Visit (HOSPITAL_COMMUNITY): Payer: BC Managed Care – PPO | Attending: Cardiovascular Disease | Admitting: Radiology

## 2014-10-26 DIAGNOSIS — R079 Chest pain, unspecified: Secondary | ICD-10-CM

## 2014-10-26 MED ORDER — TECHNETIUM TC 99M SESTAMIBI GENERIC - CARDIOLITE
33.0000 | Freq: Once | INTRAVENOUS | Status: AC | PRN
  Administered 2014-10-26: 33 via INTRAVENOUS

## 2014-10-26 MED ORDER — ADENOSINE (DIAGNOSTIC) 3 MG/ML IV SOLN
0.5600 mg/kg | Freq: Once | INTRAVENOUS | Status: AC
  Administered 2014-10-26: 42.3 mg via INTRAVENOUS

## 2014-10-26 MED ORDER — TECHNETIUM TC 99M SESTAMIBI GENERIC - CARDIOLITE
11.0000 | Freq: Once | INTRAVENOUS | Status: AC | PRN
  Administered 2014-10-26: 11 via INTRAVENOUS

## 2014-10-26 NOTE — Progress Notes (Signed)
[facility] SITE 3 NUCLEAR MED [address] [place], [place] [address] [phone]    Cardiology Nuclear Med Study  Jeremy Wagner is a 54 y.o. male     MRN : [id number]     DOB: 07/21/1961  Procedure Date: 10/26/2014  Nuclear Med Background Indication for Stress Test:  Evaluation for Ischemia and Post Hospital History:  None indicated Cardiac Risk Factors: Hypertension and LBBB  Symptoms:  Chest Pain and Fatigue   Nuclear Pre-Procedure Caffeine/Decaff Intake:  None NPO After: 12:00am   Lungs:  clear O2 Sat: 98% on room air. IV 0.9% NS with Angio Cath:  22g  IV Site: L Forearm  IV Started by:  Frederick Peers, EMT-P  Chest Size (in):  40 Cup Size: n/a  Height:    Weight:  166 lb (75.297 kg)  BMI:  Body mass index is 23.82 kg/(m^2). Tech Comments:  n/a    Nuclear Med Study 1 or 2 day study: 1 day  Stress Test Type:  Adenosine  Reading MD: n/a  Order Authorizing Provider:  Verdis Prime, MD  Resting Radionuclide: Technetium 45m Sestamibi  Resting Radionuclide Dose: 11.0 mCi   Stress Radionuclide:  Technetium 61m Sestamibi  Stress Radionuclide Dose: 33.0 mCi           Stress Protocol Rest HR: 53 Stress HR: 86  Rest BP: 133/88 Stress BP: 107/66  Exercise Time (min): n/a METS: n/a   Predicted Max HR: 166 bpm % Max HR: 51.81 bpm Rate Pressure Product: 9202   Dose of Adenosine (mg):  42.2 mg Dose of Lexiscan: n/a mg  Dose of Atropine (mg): n/a Dose of Dobutamine: n/a mcg/kg/min (at max HR)  Stress Test Technologist: Nelson Chimes, BS-ES  Nuclear Technologist:  Kerby Nora, CNMT     Rest Procedure:  Myocardial perfusion imaging was performed at rest 45 minutes following the intravenous administration of Technetium 23m Sestamibi. Rest ECG: NSR-LBBB  Stress Procedure:  The patient received IV adenosine at 140 mcg/kg/min for 4 minutes.  Technetium 95m Sestamibi was injected at the 2 minute mark and quantitative spect images were obtained after a 45 minute  delay.  During the infusion of Adenosine the patient complained of head pressure but this completely resolved in recovery.  Stress ECG: No significant change from baseline ECG  QPS Raw Data Images:  Normal; no motion artifact; normal heart/lung ratio. Stress Images:  There is decreased uptake in the septum. Rest Images:  There is decreased uptake in the septum. Subtraction (SDS):  There is a fixed defect that is most consistent with a previous infarction. Transient Ischemic Dilatation (Normal <1.22):  0.98 Lung/Heart Ratio (Normal <0.45):  0.23  Quantitative Gated Spect Images QGS EDV:  177 ml QGS ESV:  112 ml  Impression Exercise Capacity:  Adenosine study with no exercise. BP Response:  Normal blood pressure response. Clinical Symptoms:  No chest pain. ECG Impression:  Baseline:  LBBB.  EKG uninterpretable due to LBBB at rest and stress. Comparison with Prior Nuclear Study: No previous nuclear study performed  Overall Impression:  Intermediate risk stress nuclear study.There is a medium size defect of moderate severity consistent with scar involving the apex and apical septal and midanteroseptal segments.  There is minimal reversibility SDS2. There is LV systolic dysfunction with EF 37%.  LV Ejection Fraction: 37%.  LV Wall Motion:  Global hypokinesis.  Cassell Clement MD

## 2014-11-05 ENCOUNTER — Ambulatory Visit (INDEPENDENT_AMBULATORY_CARE_PROVIDER_SITE_OTHER): Payer: BC Managed Care – PPO | Admitting: Interventional Cardiology

## 2014-11-05 ENCOUNTER — Encounter: Payer: Self-pay | Admitting: Interventional Cardiology

## 2014-11-05 VITALS — BP 114/80 | HR 57 | Ht 71.0 in | Wt 170.0 lb

## 2014-11-05 DIAGNOSIS — I429 Cardiomyopathy, unspecified: Secondary | ICD-10-CM

## 2014-11-05 DIAGNOSIS — I5032 Chronic diastolic (congestive) heart failure: Secondary | ICD-10-CM

## 2014-11-05 DIAGNOSIS — I447 Left bundle-branch block, unspecified: Secondary | ICD-10-CM

## 2014-11-05 DIAGNOSIS — I10 Essential (primary) hypertension: Secondary | ICD-10-CM

## 2014-11-05 DIAGNOSIS — I2510 Atherosclerotic heart disease of native coronary artery without angina pectoris: Secondary | ICD-10-CM

## 2014-11-05 DIAGNOSIS — I428 Other cardiomyopathies: Secondary | ICD-10-CM

## 2014-11-05 MED ORDER — NITROGLYCERIN 0.4 MG SL SUBL: 0.4000 mg | SUBLINGUAL_TABLET | SUBLINGUAL | Status: DC | PRN

## 2014-11-05 MED ORDER — CARVEDILOL 6.25 MG PO TABS: 6.2500 mg | ORAL_TABLET | Freq: Two times a day (BID) | ORAL | Status: DC

## 2014-11-05 NOTE — Progress Notes (Signed)
Patient ID: Jeremy Wagner, male   DOB: 1961/01/15, 54 y.o.   MRN: [id number]     Cardiology Office Note   Date:  11/05/2014   ID:  Jeremy Wagner, DOB October 23, 1960, MRN [id number]  PCP:  Georgann Housekeeper, MD  Cardiologist:   Lesleigh Noe, MD   No chief complaint on file.     History of Present Illness: Jeremy Wagner is a 54 y.o. male who presents for follow-up after an episode of angina. He had an intermediate risk myocardial perfusion study with an EF of 37% and minimal ischemia. He has felt well since discharge from the hospital. For some reason his beta blocker dose was down titrated in the hospital.  He tells me that prior to hospitalization he had been taking his antihypertensive therapy sporadically. He is now back on the medication daily. We have had difficulty in the past with hypotension when taking 20 mg of lisinopril. He can only tolerate 10 mg per day.    Past Medical History  Diagnosis Date  . Nonischemic cardiomyopathy     LVEF 35-40% by cath 01-13-08. Normal coronary arteries  . Hypertension   . LBBB (left bundle branch block)   . Chronic diastolic heart failure   . Reflux   . Hematuria   . Anxiety   . Insomnia     Past Surgical History  Procedure Laterality Date  . Cardiac catheterization  01/2008    clean Coronaries, HTN CM??; EF ~30-35%     Current Outpatient Prescriptions  Medication Sig Dispense Refill  . aspirin EC 81 MG tablet Take 1 tablet (81 mg total) by mouth daily. 30 tablet 0  . carvedilol (COREG) 6.25 MG tablet Take 0.5 tablets (3.125 mg total) by mouth 2 (two) times daily with a meal. 90 tablet 1  . lisinopril (PRINIVIL,ZESTRIL) 10 MG tablet Take 10 mg by mouth daily.     No current facility-administered medications for this visit.    Allergies:   Review of patient's allergies indicates no known allergies.    Social History:  The patient  reports that he has never smoked. He does not have any smokeless tobacco history on file. He reports  that he does not drink alcohol or use illicit drugs.   Family History:  The patient's family history includes CAD (age of onset: 71) in his maternal aunt; Coronary artery disease (age of onset: 68) in his paternal uncle; Hypertension in his father and mother.    ROS:  Please see the history of present illness.   Otherwise, review of systems are positive for lightheadedness on certain blood pressure medications. He has had none since discharge from the hospital..   All other systems are reviewed and negative.    PHYSICAL EXAM: VS:  BP 114/80 mmHg  Pulse 57  Ht  (1.803 m)  Wt 170 lb (77.111 kg)  BMI 23.72 kg/m2  SpO2 96% , BMI Body mass index is 23.72 kg/(m^2). during our interview 150/100 in the right arm GEN: Well nourished, well developed, in no acute distress HEENT: normal Neck: no JVD, carotid bruits, or masses Cardiac: RRR; no murmurs, rubs, or gallops,no edema  Respiratory:  clear to auscultation bilaterally, normal work of breathing GI: soft, nontender, nondistended, + BS MS: no deformity or atrophy Skin: warm and dry, no rash Neuro:  Strength and sensation are intact Psych: euthymic mood, full affect   EKG:  EKG is not ordered today.    Recent Labs: 10/09/2014: ALT 23; BUN 11;  Creatinine 1.02; Hemoglobin 13.2; Platelets 184; Potassium 4.1; Sodium 137    Lipid Panel No results found for: CHOL, TRIG, HDL, CHOLHDL, VLDL, LDLCALC, LDLDIRECT    Wt Readings from Last 3 Encounters:  11/05/14 170 lb (77.111 kg)  10/26/14 166 lb (75.297 kg)  10/09/14 161 lb 2.5 oz (73.1 kg)      Other studies Reviewed: Additional studies/ records that were reviewed today include: Discharge summary from recent hospitalization. Review of the above records demonstrates: Systolic blood pressures less than 110 mmHg while hospitalized. For that reason his beta blocker was decreased in intensity.   ASSESSMENT AND PLAN:  1. Chronic systolic heart failure without symptoms of heart  failure or evidence of volume overload. EF is between 30 and 37% depending upon modality used. Echocardiographic EF was 30-35% and nuclear wall motion study was 37%. Baseline LVEF is typically in the 40-45% range. We need to maintain a strong heart failure regime as tolerated by blood pressure which has a tendency to get low with associated symptoms. We had to cut lisinopril back to 10 mg per day several years ago. 2. Labile blood pressure elevation 3. Angina pectoris with known minimal coronary disease by cath remotely and recent nuclear study suggesting minimal apical peri-infarct ischemia.   Current medicines are reviewed at length with the patient today.  The patient has concerns regarding medicines.  The following changes have been made:  Wonder if he should take aspirin daily. He has taken on an empty stomach. I encouraged him to continue aspirin but take only enteric-coated aspirin with food. Also encouraged him to take his antihypertensive regimen as prescribed. We will increase the carvedilol back to 6.25 mg twice a day. On the next visit we will consider further uptitrating the beta blocker. He is unable to tolerate a higher dose of lisinopril.  Labs/ tests ordered today include:  No orders of the defined types were placed in this encounter.     Disposition:   FU with Mendel Ryder in 6 months   Signed, Lesleigh Noe, MD  11/05/2014 10:54 AM    [facility] [address] [place], [place], [place]  [address] Phone: [phone]; Fax: [phone]

## 2014-11-05 NOTE — Patient Instructions (Signed)
Your physician has recommended you make the following change in your medication:  1) INCREASE Carvedilol to 6.25mg  Twice Daily. An Rx has been sent to your pharmacy 2) START Nitro-Glycerin use as directed. An Rx has been sent to your pharmacy  Your physician recommends that you schedule a follow-up appointment in: 6 weeks   Nitroglycerin sublingual tablets What is this medicine? NITROGLYCERIN (nye troe GLI ser in) is a type of vasodilator. It relaxes blood vessels, increasing the blood and oxygen supply to your heart. This medicine is used to relieve chest pain caused by angina. It is also used to prevent chest pain before activities like climbing stairs, going outdoors in cold weather, or sexual activity. This medicine may be used for other purposes; ask your health care provider or pharmacist if you have questions. COMMON BRAND NAME(S): Nitroquick, Nitrostat, Nitrotab What should I tell my health care provider before I take this medicine? They need to know if you have any of these conditions: -anemia -head injury, recent stroke, or bleeding in the brain -liver disease -previous heart attack -an unusual or allergic reaction to nitroglycerin, other medicines, foods, dyes, or preservatives -pregnant or trying to get pregnant -breast-feeding How should I use this medicine? Take this medicine by mouth as needed. At the first sign of an angina attack (chest pain or tightness) place one tablet under your tongue. You can also take this medicine 5 to 10 minutes before an event likely to produce chest pain. Follow the directions on the prescription label. Let the tablet dissolve under the tongue. Do not swallow whole. Replace the dose if you accidentally swallow it. It will help if your mouth is not dry. Saliva around the tablet will help it to dissolve more quickly. Do not eat or drink, smoke or chew tobacco while a tablet is dissolving. If you are not better within 5 minutes after taking ONE dose of  nitroglycerin, call 9-1-1 immediately to seek emergency medical care. Do not take more than 3 nitroglycerin tablets over 15 minutes. If you take this medicine often to relieve symptoms of angina, your doctor or health care professional may provide you with different instructions to manage your symptoms. If symptoms do not go away after following these instructions, it is important to call 9-1-1 immediately. Do not take more than 3 nitroglycerin tablets over 15 minutes. Talk to your pediatrician regarding the use of this medicine in children. Special care may be needed. Overdosage: If you think you have taken too much of this medicine contact a poison control center or emergency room at once. NOTE: This medicine is only for you. Do not share this medicine with others. What if I miss a dose? This does not apply. This medicine is only used as needed. What may interact with this medicine? Do not take this medicine with any of the following medications: -certain migraine medicines like ergotamine and dihydroergotamine (DHE) -medicines used to treat erectile dysfunction like sildenafil, tadalafil, and vardenafil -riociguat This medicine may also interact with the following medications: -alteplase -aspirin -heparin -medicines for high blood pressure -medicines for mental depression -other medicines used to treat angina -phenothiazines like chlorpromazine, mesoridazine, prochlorperazine, thioridazine This list may not describe all possible interactions. Give your health care provider a list of all the medicines, herbs, non-prescription drugs, or dietary supplements you use. Also tell them if you smoke, drink alcohol, or use illegal drugs. Some items may interact with your medicine. What should I watch for while using this medicine? Tell your doctor  or health care professional if you feel your medicine is no longer working. Keep this medicine with you at all times. Sit or lie down when you take your  medicine to prevent falling if you feel dizzy or faint after using it. Try to remain calm. This will help you to feel better faster. If you feel dizzy, take several deep breaths and lie down with your feet propped up, or bend forward with your head resting between your knees. You may get drowsy or dizzy. Do not drive, use machinery, or do anything that needs mental alertness until you know how this drug affects you. Do not stand or sit up quickly, especially if you are an older patient. This reduces the risk of dizzy or fainting spells. Alcohol can make you more drowsy and dizzy. Avoid alcoholic drinks. Do not treat yourself for coughs, colds, or pain while you are taking this medicine without asking your doctor or health care professional for advice. Some ingredients may increase your blood pressure. What side effects may I notice from receiving this medicine? Side effects that you should report to your doctor or health care professional as soon as possible: -blurred vision -dry mouth -skin rash -sweating -the feeling of extreme pressure in the head -unusually weak or tired Side effects that usually do not require medical attention (report to your doctor or health care professional if they continue or are bothersome): -flushing of the face or neck -headache -irregular heartbeat, palpitations -nausea, vomiting This list may not describe all possible side effects. Call your doctor for medical advice about side effects. You may report side effects to FDA at 1-800-FDA-1088. Where should I keep my medicine? Keep out of the reach of children. Store at room temperature between 20 and 25 degrees C (68 and 77 degrees F). Store in Retail buyer. Protect from light and moisture. Keep tightly closed. Throw away any unused medicine after the expiration date. NOTE: This sheet is a summary. It may not cover all possible information. If you have questions about this medicine, talk to your doctor,  pharmacist, or health care provider.  2015, Elsevier/Gold Standard. (2013-06-29 17:57:36)

## 2014-11-09 ENCOUNTER — Other Ambulatory Visit: Payer: Self-pay | Admitting: *Deleted

## 2014-11-09 MED ORDER — CARVEDILOL 6.25 MG PO TABS: 6.2500 mg | ORAL_TABLET | Freq: Two times a day (BID) | ORAL | Status: DC

## 2014-12-28 ENCOUNTER — Ambulatory Visit (INDEPENDENT_AMBULATORY_CARE_PROVIDER_SITE_OTHER): Payer: BC Managed Care – PPO | Admitting: Interventional Cardiology

## 2014-12-28 ENCOUNTER — Encounter: Payer: Self-pay | Admitting: Interventional Cardiology

## 2014-12-28 VITALS — BP 120/80 | HR 63 | Ht 71.0 in | Wt 170.0 lb

## 2014-12-28 DIAGNOSIS — I5032 Chronic diastolic (congestive) heart failure: Secondary | ICD-10-CM

## 2014-12-28 DIAGNOSIS — I428 Other cardiomyopathies: Secondary | ICD-10-CM

## 2014-12-28 DIAGNOSIS — I2510 Atherosclerotic heart disease of native coronary artery without angina pectoris: Secondary | ICD-10-CM

## 2014-12-28 DIAGNOSIS — I429 Cardiomyopathy, unspecified: Secondary | ICD-10-CM | POA: Diagnosis not present

## 2014-12-28 DIAGNOSIS — I10 Essential (primary) hypertension: Secondary | ICD-10-CM

## 2014-12-28 DIAGNOSIS — I447 Left bundle-branch block, unspecified: Secondary | ICD-10-CM | POA: Diagnosis not present

## 2014-12-28 NOTE — Progress Notes (Signed)
Cardiology Office Note   Date:  12/28/2014   ID:  Jeremy Wagner, DOB 06-08-1961, MRN [id number]  PCP:  Georgann Housekeeper, MD  Cardiologist:    Lesleigh Noe, MD   Chief Complaint  Patient presents with  . Congestive Heart Failure      History of Present Illness: Jeremy Wagner is a 54 y.o. male who presents for nonischemic heart disease, non-critical coronary disease, hypertension, and angina.  Since last office visit, Jeremy Wagner is doing well. He denies angina. He has not used nitroglycerin. He is taking his medications as prescribed. He is back to full work without limitations.    Past Medical History  Diagnosis Date  . Nonischemic cardiomyopathy     LVEF 35-40% by cath 01-13-08. Normal coronary arteries  . Hypertension   . LBBB (left bundle branch block)   . Chronic diastolic heart failure   . Reflux   . Hematuria   . Anxiety   . Insomnia     Past Surgical History  Procedure Laterality Date  . Cardiac catheterization  01/2008    clean Coronaries, HTN CM??; EF ~30-35%     Current Outpatient Prescriptions  Medication Sig Dispense Refill  . aspirin EC 81 MG tablet Take 1 tablet (81 mg total) by mouth daily. 30 tablet 0  . carvedilol (COREG) 6.25 MG tablet Take 1 tablet (6.25 mg total) by mouth 2 (two) times daily with a meal. 180 tablet 3  . lisinopril (PRINIVIL,ZESTRIL) 10 MG tablet Take 10 mg by mouth daily.    . nitroGLYCERIN (NITROSTAT) 0.4 MG SL tablet Place 1 tablet (0.4 mg total) under the tongue every 5 (five) minutes as needed. (Patient taking differently: Place 0.4 mg under the tongue every 5 (five) minutes as needed for chest pain. ) 25 tablet 3   No current facility-administered medications for this visit.    Allergies:   Review of patient's allergies indicates no known allergies.    Social History:  The patient  reports that he has never smoked. He does not have any smokeless tobacco history on file. He reports that he does not drink alcohol or use illicit  drugs.   Family History:  The patient's family history includes CAD (age of onset: 70) in his maternal aunt; Coronary artery disease (age of onset: 72) in his paternal uncle; Hypertension in his father and mother.    ROS:  Please see the history of present illness.   Otherwise, review of systems are positive for excessive daytime sleepiness.   All other systems are reviewed and negative.    PHYSICAL EXAM: VS:  BP 120/80 mmHg  Pulse 63  Ht  (1.803 m)  Wt 170 lb (77.111 kg)  BMI 23.72 kg/m2 , BMI Body mass index is 23.72 kg/(m^2). GEN: Well nourished, well developed, in no acute distress HEENT: normal Neck: no JVD, carotid bruits, or masses Cardiac: RRR; no murmurs, rubs, or gallops,no edema  Respiratory:  clear to auscultation bilaterally, normal work of breathing GI: soft, nontender, nondistended, + BS MS: no deformity or atrophy Skin: warm and dry, no rash Neuro:  Strength and sensation are intact Psych: euthymic mood, full affect   EKG:  EKG is not ordered today. The ekg ordered today demonstrates    Recent Labs: 10/09/2014: ALT 23; BUN 11; Creatinine 1.02; Hemoglobin 13.2; Platelets 184; Potassium 4.1; Sodium 137    Lipid Panel No results found for: CHOL, TRIG, HDL, CHOLHDL, VLDL, LDLCALC, LDLDIRECT    Wt Readings from Last  3 Encounters:  12/28/14 170 lb (77.111 kg)  11/05/14 170 lb (77.111 kg)  10/26/14 166 lb (75.297 kg)      Other studies Reviewed: Additional studies/ records that were reviewed today include: .    ASSESSMENT AND PLAN:  Nonischemic cardiomyopathy: No volume overload  LBBB (left bundle branch block)  Essential hypertension, benign: Controlled  Chronic diastolic heart failure: No evidence of volume overload      Current medicines are reviewed at length with the patient today.  The patient does not have concerns regarding medicines.  The following changes have been made:  no change  Labs/ tests ordered today include:  No  orders of the defined types were placed in this encounter.     Disposition:   FU with Mendel Ryder in 8 months   Signed, Lesleigh Noe, MD  12/28/2014 8:20 AM    [facility] [address] [place], [place], [place]  [address] Phone: [phone]; Fax: [phone]

## 2014-12-28 NOTE — Patient Instructions (Signed)
Medication Instructions:  Your physician recommends that you continue on your current medications as directed. Please refer to the Current Medication list given to you today.  Labwork: No new orders.  Testing/Procedures: No new orders.  Follow-Up: Your physician wants you to follow-up in: 6-9 MONTHS with Dr [name].  You will receive a reminder letter in the mail two months in advance. If you don't receive a letter, please call our office to schedule the follow-up appointment.  Any Other Special Instructions Will Be Listed Below (If Applicable).

## 2015-05-10 ENCOUNTER — Other Ambulatory Visit: Payer: Self-pay | Admitting: Interventional Cardiology

## 2015-07-22 ENCOUNTER — Telehealth: Payer: Self-pay | Admitting: Interventional Cardiology

## 2015-07-22 NOTE — Telephone Encounter (Signed)
New Message  Pt c/o BP issue:  1. What are your last 5 BP readings? Only 1 reading   @ 9:45am 152/85 BP  2. Are you having any other symptoms (ex. Dizziness, headache, blurred vision, passed out)?  No 3. What is your medication issue? Pt states that he was given medication for pain from PCP. Tramadol 50 MG Request a call back to determine if this could be the reason why his BP is high. Please call

## 2015-07-22 NOTE — Telephone Encounter (Signed)
F/u    Pt calling back waiting on call from nurse,he want to come in today. Please call pt.

## 2015-07-22 NOTE — Telephone Encounter (Signed)
Agree 

## 2015-07-22 NOTE — Telephone Encounter (Signed)
Returned pt call. Pt sts that he hurt his back and has been taking Meloxicam Tramadol ,and Tizanidine prn. Pt sts that his BP this morning was 152/85. Pt is asymptomatic, adv pt that pain and the anti-inflammatory med could cause his bp to be elevated. Recommend that pt continue to monitor his bp daily for the rest of the wk, and call back if bp is consistently elevated. Pt sts that he will not take any more Meloxicam.Adv pt Tramadol should not affect his bp, he could also take OTC Tylenol, adv pt to f//u with his pcp for other pain med recommendations. Pt appreciative for the call back and verbalized understanding.

## 2015-08-08 ENCOUNTER — Other Ambulatory Visit: Payer: Self-pay | Admitting: Interventional Cardiology

## 2015-10-08 ENCOUNTER — Other Ambulatory Visit: Payer: Self-pay | Admitting: Interventional Cardiology

## 2015-10-08 MED ORDER — LISINOPRIL 10 MG PO TABS: 10.0000 mg | ORAL_TABLET | Freq: Every day | ORAL | Status: DC

## 2015-10-08 NOTE — Telephone Encounter (Signed)
Patient called and requested a refill on lisinopril 10mg  tablet instead of the 20mg  tablet be sent to his mail order pharmacy so that he dosen't have to bite them in half.

## 2015-11-16 ENCOUNTER — Other Ambulatory Visit: Payer: Self-pay | Admitting: Interventional Cardiology

## 2015-12-12 ENCOUNTER — Other Ambulatory Visit: Payer: Self-pay | Admitting: *Deleted

## 2015-12-12 MED ORDER — LISINOPRIL 10 MG PO TABS: 10.0000 mg | ORAL_TABLET | Freq: Every day | ORAL | Status: DC

## 2015-12-20 ENCOUNTER — Encounter: Payer: Self-pay | Admitting: Interventional Cardiology

## 2015-12-20 ENCOUNTER — Ambulatory Visit (INDEPENDENT_AMBULATORY_CARE_PROVIDER_SITE_OTHER): Payer: BC Managed Care – PPO | Admitting: Interventional Cardiology

## 2015-12-20 VITALS — BP 132/94 | HR 51 | Ht 71.0 in | Wt 166.8 lb

## 2015-12-20 DIAGNOSIS — I5032 Chronic diastolic (congestive) heart failure: Secondary | ICD-10-CM | POA: Diagnosis not present

## 2015-12-20 DIAGNOSIS — I2510 Atherosclerotic heart disease of native coronary artery without angina pectoris: Secondary | ICD-10-CM | POA: Diagnosis not present

## 2015-12-20 DIAGNOSIS — I10 Essential (primary) hypertension: Secondary | ICD-10-CM

## 2015-12-20 DIAGNOSIS — I447 Left bundle-branch block, unspecified: Secondary | ICD-10-CM

## 2015-12-20 NOTE — Progress Notes (Signed)
Cardiology Office Note   Date:  12/20/2015   ID:  Jeremy Wagner, DOB 20-Dec-1960, MRN [id number]  PCP:  Georgianne Fick, MD  Cardiologist:  Lesleigh Noe, MD   Chief Complaint  Patient presents with  . Hypertension  . Congestive Heart Failure      History of Present Illness: Jeremy Wagner is a 55 y.o. male who presents for Hypertension, nonobstructive coronary disease, long-standing history of LV systolic dysfunction, left bundle branch block, and nonischemic cardiomyopathy.  No heart failure symptoms. Last echo done greater than 12 months ago reveal LVEF 35% or less. Left bundle branch block has been present. QRS duration is increasing over the years. In 2015 and was 156 ms. Now the QRS duration is 172 ms.    Past Medical History  Diagnosis Date  . Nonischemic cardiomyopathy (HCC)     LVEF 35-40% by cath 01-13-08. Normal coronary arteries  . Hypertension   . LBBB (left bundle branch block)   . Chronic diastolic heart failure (HCC)   . Reflux   . Hematuria   . Anxiety   . Insomnia     Past Surgical History  Procedure Laterality Date  . Cardiac catheterization  01/2008    clean Coronaries, HTN CM??; EF ~30-35%     Current Outpatient Prescriptions  Medication Sig Dispense Refill  . aspirin EC 81 MG tablet Take 1 tablet (81 mg total) by mouth daily. 30 tablet 0  . carvedilol (COREG) 6.25 MG tablet Take 1 tablet (6.25 mg total) by mouth 2 (two) times daily with a meal. 180 tablet 0  . lisinopril (PRINIVIL,ZESTRIL) 10 MG tablet Take 1 tablet (10 mg total) by mouth daily. 90 tablet 0  . nitroGLYCERIN (NITROSTAT) 0.4 MG SL tablet Place 0.4 mg under the tongue every 5 (five) minutes as needed for chest pain.     No current facility-administered medications for this visit.    Allergies:   Review of patient's allergies indicates no known allergies.    Social History:  The patient  reports that he has never smoked. He has never used smokeless tobacco. He reports  that he does not drink alcohol or use illicit drugs.   Family History:  The patient's family history includes CAD (age of onset: 59) in his maternal aunt; Coronary artery disease (age of onset: 66) in his paternal uncle; Hypertension in his father and mother.    ROS:  Please see the history of present illness.   Otherwise, review of systems are positive for Bilateral shoulder discomfort. Arms ache in the morning until he loosens up..   All other systems are reviewed and negative.    PHYSICAL EXAM: VS:  BP 132/94 mmHg  Pulse 51  Ht 5\' 11"  (1.803 m)  Wt 166 lb 12.8 oz (75.66 kg)  BMI 23.27 kg/m2 , BMI Body mass index is 23.27 kg/(m^2). GEN: Well nourished, well developed, in no acute distress HEENT: normal Neck: no JVD, carotid bruits, or masses Cardiac: RRR.  There is no murmur, rub, but an S for gallop resident. There is no edema. Respiratory:  clear to auscultation bilaterally, normal work of breathing. GI: soft, nontender, nondistended, + BS MS: no deformity or atrophy Skin: warm and dry, no rash Neuro:  Strength and sensation are intact Psych: euthymic mood, full affect   EKG:  EKG is ordered today. The ekg reveals bundle branch block with QRS duration 172 ms   Recent Labs: No results found for requested labs within last 365 days.  Lipid Panel No results found for: CHOL, TRIG, HDL, CHOLHDL, VLDL, LDLCALC, LDLDIRECT    Wt Readings from Last 3 Encounters:  12/20/15 166 lb 12.8 oz (75.66 kg)  12/28/14 170 lb (77.111 kg)  11/05/14 170 lb (77.111 kg)      Other studies Reviewed: Additional studies/ records that were reviewed today include: Reviewed recent imaging studies and EKGs.. The findings include increasing left bundle. Last echo EF 30-35%..    ASSESSMENT AND PLAN:  1. CAD in native artery Asymptomatic  2. LBBB (left bundle branch block) QRS duration 170 ms.  3. Essential hypertension, benign Well controlled.  4. Chronic systolic heart failure  (HCC) Last EF by echo in February 2016 ,  30-35%. LVEF continues to deteriorate despite excellent medical therapy and compliance.    Current medicines are reviewed at length with the patient today.  The patient has the following concerns regarding medicines: .  The following changes/actions have been instituted:    Refer to EP for consideration of resynchronization therapy to improve LVEF in the setting of left bundle branch block with QRS duration of 170 ms.  In past when I have attempted to up titrate beta blocker and ACE inhibitor therapy, he has been bothered by hypotension. Blood pressure today suggests otherwise.   Labs/ tests ordered today include:  No orders of the defined types were placed in this encounter.     Disposition:   FU with HS in 1 year  Signed, Lesleigh Noe, MD  12/20/2015 12:16 PM    [facility] [address] [place], [place], [place]  [address] Phone: [phone]; Fax: [phone]

## 2015-12-20 NOTE — Patient Instructions (Signed)
Medication Instructions:  Your physician recommends that you continue on your current medications as directed. Please refer to the Current Medication list given to you today.   Labwork: None   Testing/Procedures: None   Follow-Up: You have been referred to EP for evaluation for resynchronization therapy/BiV device.  Your physician wants you to follow-up in: 1 year with Dr [name] (April 2018).  You will receive a reminder letter in the mail two months in advance. If you don't receive a letter, please call our office to schedule the follow-up appointment.        If you need a refill on your cardiac medications before your next appointment, please call your pharmacy.

## 2015-12-26 NOTE — Progress Notes (Signed)
ELECTROPHYSIOLOGY CONSULT NOTE  Patient ID: Jeremy Wagner, MRN: [id number], DOB/AGE: Sep 03, 1961 55 y.o. Admit date: (Not on file) Date of Consult: 12/27/2015  Primary Physician: Georgianne Fick, MD Primary Cardiologist:[facility] Consulting Physician [facility]  Chief Complaint: ICD   HPI Jeremy Wagner is a 55 y.o. male   referred for consideration of an ICD.  He has a long-standing nonischemic cardiomyopathy initially diagnosed 2009. Interestingly, Myoview scanning 2016 was markedly abnormal suggestive of a prior myocardial infarction.  Last LVEF was 2016 at 25%. He has left bundle branch block. This is been gradually worsening  Functional status is surprisingly good. He can climb stairs and carrying things. He denies limitations. He has no orthopnea nocturnal dyspnea or peripheral edema. He's had no syncope.    Past Medical History  Diagnosis Date  . Nonischemic cardiomyopathy (HCC)     LVEF 35-40% by cath 01-13-08. Normal coronary arteries  . Hypertension   . LBBB (left bundle branch block)   . Chronic diastolic heart failure (HCC)   . Reflux   . Hematuria   . Anxiety   . Insomnia       Surgical History:  Past Surgical History  Procedure Laterality Date  . Cardiac catheterization  01/2008    clean Coronaries, HTN CM??; EF ~30-35%     Home Meds: Prior to Admission medications   Medication Sig Start Date End Date Taking? Authorizing Provider  aspirin EC 81 MG tablet Take 1 tablet (81 mg total) by mouth daily. 10/10/14   Elease Etienne, MD  carvedilol (COREG) 6.25 MG tablet Take 1 tablet (6.25 mg total) by mouth 2 (two) times daily with a meal. 11/18/15   Lyn Records, MD  lisinopril (PRINIVIL,ZESTRIL) 10 MG tablet Take 1 tablet (10 mg total) by mouth daily. 12/12/15   Lyn Records, MD  nitroGLYCERIN (NITROSTAT) 0.4 MG SL tablet Place 0.4 mg under the tongue every 5 (five) minutes as needed for chest pain.    Historical Provider, MD    Allergies: No Known  Allergies  Social History   Social History  . Marital Status: Married    Spouse Name: N/A  . Number of Children: N/A  . Years of Education: N/A   Occupational History  . Not on file.   Social History Main Topics  . Smoking status: Never Smoker   . Smokeless tobacco: Never Used  . Alcohol Use: No  . Drug Use: No  . Sexual Activity: Not on file   Other Topics Concern  . Not on file   Social History Narrative   Works for the [employer]   Physically very active job.   Has 2 summer jobs both of which are very physical   [place] a lot   Drinks ~ 24 Oz of caffeine in am   3 kids   Married   Lives in [place] [employer]   Never smoker     Family History  Problem Relation Age of Onset  . CAD Maternal Aunt 50  . Coronary artery disease Paternal Uncle 22  . Hypertension Mother   . Hypertension Father      ROS:  Please see the history of present illness.     All other systems reviewed and negative.    Physical Exam:   Blood pressure 132/88, pulse 55, height 5\' 11"  (1.803 m), weight 162 lb 3.2 oz (73.573 kg), SpO2 55 %. General: Well developed, well nourished male in no acute distress. Head: Normocephalic,  atraumatic, sclera non-icteric, no xanthomas, nares are without discharge. EENT: normal  Lymph Nodes:  none Neck: Negative for carotid bruits. JVD not elevated. Back:without scoliosis kyphosis  Lungs: Clear bilaterally to auscultation without wheezes, rales, or rhonchi. Breathing is unlabored. Heart: RRR with S1 S2. No  murmur . No rubs, or gallops appreciated. PMI laterally displaced Abdomen: Soft, non-tender, non-distended with normoactive bowel sounds. No hepatomegaly. No rebound/guarding. No obvious abdominal masses. Msk:  Strength and tone appear normal for age. Extremities: No clubbing or cyanosis. No edema.  Distal pedal pulses are 2+ and equal bilaterally. Skin: Warm and Dry Neuro: Alert and oriented X 3. CN [name]-XII intact Grossly normal sensory and motor function  . Psych:  Responds to questions appropriately with a normal affect.      Labs: Cardiac Enzymes No results for input(s): CKTOTAL, CKMB, TROPONINI in the last 72 hours. CBC Lab Results  Component Value Date   WBC 6.5 10/09/2014   HGB 13.2 10/09/2014   HCT 39.7 10/09/2014   MCV 95.0 10/09/2014   PLT 184 10/09/2014   PROTIME: No results for input(s): LABPROT, INR in the last 72 hours. Chemistry No results for input(s): NA, K, CL, CO2, BUN, CREATININE, CALCIUM, PROT, BILITOT, ALKPHOS, ALT, AST, GLUCOSE in the last 168 hours.  Invalid input(s): LABALBU Lipids No results found for: CHOL, HDL, LDLCALC, TRIG BNP No results found for: PROBNP Thyroid Function Tests: No results for input(s): TSH, T4TOTAL, T3FREE, THYROIDAB in the last 72 hours.  Invalid input(s): FREET3 Miscellaneous No results found for: DDIMER  Radiology/Studies:  No results found.  EKG:  12/20/15 Sinus rhythm at 52 Intervals  17/17/46 Axis left -77  IVCD with Q waves in lead 1 and L   Assessment and Plan: *  Nonischemic cardiomyopathy  IVCD with a left bundle branch block like appearance  Congestive heart failure-class I  Hypertension   The patient has nonischemic cardiomyopathy; he has no clinical symptoms of heart failure. Hence, there is no indication for ICD implantation or CRT.  I will review with Dr. [name] whether there is any role for alternative medical therapies including the use of BiDil, Aldactone and/or Entresto. I've also encouraged him to have his children evaluated echocardiographically as well as his siblings as genetic contributions to dilated myopathy comprises significant minority.  We were glad to see him if he were to develop any symptoms suggestive of heart failure and/or electrical disturbance i.e. syncope or tachycardia palpitations      Sherryl Manges

## 2015-12-27 ENCOUNTER — Ambulatory Visit (INDEPENDENT_AMBULATORY_CARE_PROVIDER_SITE_OTHER): Payer: BC Managed Care – PPO | Admitting: Internal Medicine

## 2015-12-27 ENCOUNTER — Encounter: Payer: Self-pay | Admitting: Internal Medicine

## 2015-12-27 VITALS — BP 132/88 | HR 55 | Ht 71.0 in | Wt 162.2 lb

## 2015-12-27 DIAGNOSIS — I5032 Chronic diastolic (congestive) heart failure: Secondary | ICD-10-CM

## 2015-12-27 DIAGNOSIS — I429 Cardiomyopathy, unspecified: Secondary | ICD-10-CM

## 2015-12-27 DIAGNOSIS — I428 Other cardiomyopathies: Secondary | ICD-10-CM

## 2015-12-27 DIAGNOSIS — I447 Left bundle-branch block, unspecified: Secondary | ICD-10-CM | POA: Diagnosis not present

## 2015-12-27 NOTE — Patient Instructions (Signed)
Medication Instructions: - Your physician recommends that you continue on your current medications as directed. Please refer to the Current Medication list given to you today.  Labwork: - none  Procedures/Testing: - none  Follow-Up: - Dr. Klein will see you back on an as needed basis.  Any Additional Special Instructions Will Be Listed Below (If Applicable).     If you need a refill on your cardiac medications before your next appointment, please call your pharmacy.   

## 2016-01-19 ENCOUNTER — Other Ambulatory Visit: Payer: Self-pay | Admitting: Interventional Cardiology

## 2016-01-20 ENCOUNTER — Other Ambulatory Visit: Payer: Self-pay | Admitting: *Deleted

## 2016-01-20 MED ORDER — CARVEDILOL 6.25 MG PO TABS: 6.2500 mg | ORAL_TABLET | Freq: Two times a day (BID) | ORAL | Status: DC

## 2016-01-21 ENCOUNTER — Other Ambulatory Visit: Payer: Self-pay | Admitting: Interventional Cardiology

## 2016-05-09 ENCOUNTER — Other Ambulatory Visit: Payer: Self-pay | Admitting: Interventional Cardiology

## 2016-12-01 ENCOUNTER — Other Ambulatory Visit: Payer: Self-pay | Admitting: Interventional Cardiology

## 2016-12-04 ENCOUNTER — Encounter (HOSPITAL_COMMUNITY): Payer: Self-pay

## 2016-12-04 ENCOUNTER — Inpatient Hospital Stay (HOSPITAL_COMMUNITY)
Admission: EM | Admit: 2016-12-04 | Discharge: 2016-12-10 | DRG: 227 | Disposition: A | Discharge status: Home / Self Care | Payer: BC Managed Care – PPO | Attending: Internal Medicine | Admitting: Internal Medicine

## 2016-12-04 ENCOUNTER — Telehealth: Payer: Self-pay | Admitting: Interventional Cardiology

## 2016-12-04 DIAGNOSIS — R3121 Asymptomatic microscopic hematuria: Secondary | ICD-10-CM

## 2016-12-04 DIAGNOSIS — G4700 Insomnia, unspecified: Secondary | ICD-10-CM | POA: Diagnosis present

## 2016-12-04 DIAGNOSIS — D649 Anemia, unspecified: Secondary | ICD-10-CM | POA: Diagnosis present

## 2016-12-04 DIAGNOSIS — K219 Gastro-esophageal reflux disease without esophagitis: Secondary | ICD-10-CM | POA: Diagnosis present

## 2016-12-04 DIAGNOSIS — I447 Left bundle-branch block, unspecified: Secondary | ICD-10-CM | POA: Diagnosis present

## 2016-12-04 DIAGNOSIS — R55 Syncope and collapse: Secondary | ICD-10-CM

## 2016-12-04 DIAGNOSIS — I10 Essential (primary) hypertension: Secondary | ICD-10-CM | POA: Diagnosis not present

## 2016-12-04 DIAGNOSIS — Z7982 Long term (current) use of aspirin: Secondary | ICD-10-CM

## 2016-12-04 DIAGNOSIS — Z959 Presence of cardiac and vascular implant and graft, unspecified: Secondary | ICD-10-CM

## 2016-12-04 DIAGNOSIS — I5042 Chronic combined systolic (congestive) and diastolic (congestive) heart failure: Secondary | ICD-10-CM | POA: Diagnosis present

## 2016-12-04 DIAGNOSIS — I5022 Chronic systolic (congestive) heart failure: Secondary | ICD-10-CM | POA: Diagnosis present

## 2016-12-04 DIAGNOSIS — I130 Hypertensive heart and chronic kidney disease with heart failure and stage 1 through stage 4 chronic kidney disease, or unspecified chronic kidney disease: Secondary | ICD-10-CM | POA: Diagnosis present

## 2016-12-04 DIAGNOSIS — F419 Anxiety disorder, unspecified: Secondary | ICD-10-CM | POA: Diagnosis present

## 2016-12-04 DIAGNOSIS — I428 Other cardiomyopathies: Secondary | ICD-10-CM | POA: Diagnosis not present

## 2016-12-04 DIAGNOSIS — Z79899 Other long term (current) drug therapy: Secondary | ICD-10-CM

## 2016-12-04 DIAGNOSIS — I110 Hypertensive heart disease with heart failure: Secondary | ICD-10-CM | POA: Diagnosis present

## 2016-12-04 HISTORY — DX: Chronic systolic (congestive) heart failure: I50.22

## 2016-12-04 HISTORY — DX: Gastro-esophageal reflux disease without esophagitis: K21.9

## 2016-12-04 HISTORY — DX: Cardiac murmur, unspecified: R01.1

## 2016-12-04 LAB — BASIC METABOLIC PANEL
Anion gap: 7 (ref 5–15)
BUN: 9 mg/dL (ref 6–20)
CO2: 26 mmol/L (ref 22–32)
Calcium: 9.1 mg/dL (ref 8.9–10.3)
Chloride: 106 mmol/L (ref 101–111)
Creatinine, Ser: 1.12 mg/dL (ref 0.61–1.24)
GFR calc Af Amer: >60 mL/min (ref >60)
GFR calc non Af Amer: >60 mL/min (ref >60)
Glucose, Bld: 100 mg/dL — ABNORMAL HIGH (ref 65–99)
Potassium: 4 mmol/L (ref 3.5–5.1)
Sodium: 139 mmol/L (ref 135–145)

## 2016-12-04 LAB — URINALYSIS, ROUTINE W REFLEX MICROSCOPIC
Bilirubin Urine: NEGATIVE
Glucose, UA: NEGATIVE mg/dL
Ketones, ur: NEGATIVE mg/dL
Nitrite: POSITIVE — AB
Protein, ur: NEGATIVE mg/dL
Specific Gravity, Urine: 1.009 (ref 1.005–1.030)
Squamous Epithelial / LPF: NONE SEEN
pH: 6 (ref 5.0–8.0)

## 2016-12-04 LAB — CBC
HCT: 35.3 % — ABNORMAL LOW (ref 39.0–52.0)
HCT: 35.5 % — ABNORMAL LOW (ref 39.0–52.0)
Hemoglobin: 11.8 g/dL — ABNORMAL LOW (ref 13.0–17.0)
Hemoglobin: 11.8 g/dL — ABNORMAL LOW (ref 13.0–17.0)
MCH: 31.1 pg (ref 26.0–34.0)
MCH: 31.1 pg (ref 26.0–34.0)
MCHC: 33.4 g/dL (ref 30.0–36.0)
MCHC: 33.2 g/dL (ref 30.0–36.0)
MCV: 93.1 fL (ref 78.0–100.0)
MCV: 93.4 fL (ref 78.0–100.0)
Platelets: 170 10*3/uL (ref 150–400)
Platelets: 172 10*3/uL (ref 150–400)
RBC: 3.79 MIL/uL — ABNORMAL LOW (ref 4.22–5.81)
RBC: 3.8 MIL/uL — ABNORMAL LOW (ref 4.22–5.81)
RDW: 12.4 % (ref 11.5–15.5)
RDW: 12.5 % (ref 11.5–15.5)
WBC: 7.5 10*3/uL (ref 4.0–10.5)
WBC: 7.2 10*3/uL (ref 4.0–10.5)

## 2016-12-04 LAB — TROPONIN I
Troponin I: <0.03 ng/mL (ref <0.03)
Troponin I: <0.03 ng/mL (ref <0.03)

## 2016-12-04 LAB — CREATININE, SERUM
Creatinine, Ser: 1.1 mg/dL (ref 0.61–1.24)
GFR calc Af Amer: >60 mL/min (ref >60)
GFR calc non Af Amer: >60 mL/min (ref >60)

## 2016-12-04 LAB — CBG MONITORING, ED: Glucose-Capillary: 116 mg/dL — ABNORMAL HIGH (ref 65–99)

## 2016-12-04 LAB — TSH: TSH: 1.151 u[IU]/mL (ref 0.350–4.500)

## 2016-12-04 MED ORDER — CARVEDILOL 6.25 MG PO TABS
6.2500 mg | ORAL_TABLET | Freq: Two times a day (BID) | ORAL | Status: DC
  Administered 2016-12-04 – 2016-12-09 (×10): 6.25 mg via ORAL
  Filled 2016-12-04 (×10): qty 1

## 2016-12-04 MED ORDER — ASPIRIN EC 81 MG PO TBEC
81.0000 mg | DELAYED_RELEASE_TABLET | Freq: Every day | ORAL | Status: DC
  Administered 2016-12-05 – 2016-12-09 (×5): 81 mg via ORAL
  Filled 2016-12-04 (×5): qty 1

## 2016-12-04 MED ORDER — SODIUM CHLORIDE 0.9 % IV BOLUS (SEPSIS)
500.0000 mL | Freq: Once | INTRAVENOUS | Status: AC
  Administered 2016-12-04: 500 mL via INTRAVENOUS

## 2016-12-04 MED ORDER — ACETAMINOPHEN 650 MG RE SUPP: 650.0000 mg | Freq: Four times a day (QID) | RECTAL | Status: DC | PRN

## 2016-12-04 MED ORDER — SODIUM CHLORIDE 0.9% FLUSH
3.0000 mL | Freq: Two times a day (BID) | INTRAVENOUS | Status: DC
  Administered 2016-12-04 – 2016-12-08 (×5): 3 mL via INTRAVENOUS

## 2016-12-04 MED ORDER — TRAZODONE HCL 50 MG PO TABS: 25.0000 mg | ORAL_TABLET | Freq: Every evening | ORAL | Status: DC | PRN

## 2016-12-04 MED ORDER — HEPARIN SODIUM (PORCINE) 5000 UNIT/ML IJ SOLN
5000.0000 [IU] | Freq: Three times a day (TID) | INTRAMUSCULAR | Status: DC
  Administered 2016-12-04 – 2016-12-09 (×12): 5000 [IU] via SUBCUTANEOUS
  Filled 2016-12-04 (×13): qty 1

## 2016-12-04 MED ORDER — POLYETHYLENE GLYCOL 3350 17 G PO PACK: 17.0000 g | PACK | Freq: Every day | ORAL | Status: DC | PRN

## 2016-12-04 MED ORDER — LISINOPRIL 10 MG PO TABS
10.0000 mg | ORAL_TABLET | Freq: Every day | ORAL | Status: DC
  Administered 2016-12-05 – 2016-12-09 (×5): 10 mg via ORAL
  Filled 2016-12-04 (×5): qty 1

## 2016-12-04 MED ORDER — ACETAMINOPHEN 325 MG PO TABS: 650.0000 mg | ORAL_TABLET | Freq: Four times a day (QID) | ORAL | Status: DC | PRN

## 2016-12-04 MED ORDER — ONDANSETRON HCL 4 MG/2ML IJ SOLN: 4.0000 mg | Freq: Four times a day (QID) | INTRAMUSCULAR | Status: DC | PRN

## 2016-12-04 MED ORDER — NITROGLYCERIN 0.4 MG SL SUBL: 0.4000 mg | SUBLINGUAL_TABLET | SUBLINGUAL | Status: DC | PRN

## 2016-12-04 MED ORDER — ALUM & MAG HYDROXIDE-SIMETH 200-200-20 MG/5ML PO SUSP: 30.0000 mL | Freq: Four times a day (QID) | ORAL | Status: DC | PRN

## 2016-12-04 MED ORDER — ONDANSETRON HCL 4 MG PO TABS: 4.0000 mg | ORAL_TABLET | Freq: Four times a day (QID) | ORAL | Status: DC | PRN

## 2016-12-04 NOTE — ED Provider Notes (Signed)
[facility]-EMERGENCY DEPT Provider Note   CSN: [id number] Arrival date & time: 12/04/16  1157  History   Chief Complaint Chief Complaint  Patient presents with  . Loss of Consciousness    HPI Jeremy Wagner is a 56 y.o. male PMH HTN, chronic combined systolic and diastolic congestive heart failure presents after an episode of syncope. 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 He does not remember the event but remembers opening his eyes to his coworker standing over him saying his name. His coworker thinks that he was out for a couple of minutes. He says he was feeling like his usual self, denies heart racing, lightheadedness, dizziness, or nausea prior to the event. He denies post ictal state or urinary incontinence. He fell backwards up hill in the grass and did not hit his head. After passing out he was able to do a few more rounds of work. Says the work he was doing today was more extenuating than what he does at work. Says that he had eaten this morning before going to work. Denies history of syncope, seizure, stroke, or MI. He is feeling normal right now, denies cp or sob. He does describe sx of intermittent orthostatic hypotension in the past.   HPI  Past Medical History:  Diagnosis Date  . Anxiety   . Chronic diastolic heart failure (HCC)   . Hematuria   . Hypertension   . Insomnia   . LBBB (left bundle branch block)   . Nonischemic cardiomyopathy (HCC)    LVEF 35-40% by cath 01-13-08. Normal coronary arteries  . Reflux     Patient Active Problem List   Diagnosis Date Noted  . Syncope and collapse 12/04/2016  . Chest pain with moderate risk for cardiac etiology 10/09/2014  . Nonischemic cardiomyopathy (HCC)   . Chronic diastolic heart failure (HCC)   . Hematuria   . Anxiety   . Insomnia   . Reflux   . Essential hypertension, benign 04/16/2014  . LBBB (left bundle branch block) 04/16/2014    Past Surgical History:  Procedure  Laterality Date  . CARDIAC CATHETERIZATION  01/2008   clean Coronaries, HTN CM??; EF ~30-35%       Home Medications    Prior to Admission medications   Medication Sig Start Date End Date Taking? Authorizing Provider  aspirin EC 81 MG tablet Take 1 tablet (81 mg total) by mouth daily. 10/10/14   Elease Etienne, MD  carvedilol (COREG) 6.25 MG tablet TAKE 1 TABLET (6.25 MG TOTAL) BY MOUTH 2 (TWO) TIMES DAILY WITH A MEAL. 01/23/16   Lyn Records, MD  carvedilol (COREG) 6.25 MG tablet TAKE 1 TABLET BY MOUTH TWICE A DAY WITH MEALS 12/02/16   Lyn Records, MD  lisinopril (PRINIVIL,ZESTRIL) 10 MG tablet Take 1 tablet (10 mg total) by mouth daily. 12/12/15   Lyn Records, MD  lisinopril (PRINIVIL,ZESTRIL) 10 MG tablet Take 1 tablet (10 mg total) by mouth daily. 01/20/16   Lyn Records, MD  nitroGLYCERIN (NITROSTAT) 0.4 MG SL tablet Place 0.4 mg under the tongue every 5 (five) minutes as needed for chest pain.    Historical Provider, MD  nitroGLYCERIN (NITROSTAT) 0.4 MG SL tablet Place 1 tablet (0.4 mg total) under the tongue every 5 (five) minutes as needed for chest pain. 05/11/16   Lyn Records, MD    Family History Family History  Problem Relation Age of Onset  .  Hypertension Mother   . Hypertension Father   . CAD Maternal Aunt 50  . Coronary artery disease Paternal Uncle 1    Social History Social History  Substance Use Topics  . Smoking status: Never Smoker  . Smokeless tobacco: Never Used  . Alcohol use No     Allergies   Patient has no known allergies.   Review of Systems Review of Systems  Respiratory: Negative for shortness of breath.   Cardiovascular: Negative for chest pain and palpitations.  Gastrointestinal: Negative for nausea.  Neurological: Negative for dizziness, syncope, weakness and light-headedness.  All other systems reviewed and are negative.    Physical Exam Updated Vital Signs BP (!) 148/91   Pulse (!) 56   Resp 16   Ht 5\' 11"  (1.803 m)   Wt  74.8 kg   SpO2 99%   BMI 23.01 kg/m   Physical Exam  Constitutional: He is oriented to person, place, and time. He appears well-developed and well-nourished. No distress.  HENT:  Head: Normocephalic and atraumatic.  Eyes: Conjunctivae are normal. No scleral icterus.  Neck: Normal range of motion.  Cardiovascular: Normal rate, regular rhythm and intact distal pulses.  Exam reveals no friction rub.   Murmur heard. Grade II diastolic murmur. Split S2 vs gallop?   Pulmonary/Chest: Effort normal. No respiratory distress. He has no wheezes. He has no rales.  Abdominal: Soft. Bowel sounds are normal. He exhibits no distension. There is no tenderness. There is no guarding.  Neurological: He is alert and oriented to person, place, and time.  Skin: Skin is warm and dry. He is not diaphoretic.  Psychiatric: He has a normal mood and affect. His behavior is normal.     ED Treatments / Results  Labs (all labs ordered are listed, but only abnormal results are displayed) Labs Reviewed  BASIC METABOLIC PANEL - Abnormal; Notable for the following:       Result Value   Glucose, Bld 100 (*)    All other components within normal limits  CBC - Abnormal; Notable for the following:    RBC 3.79 (*)    Hemoglobin 11.8 (*)    HCT 35.3 (*)    All other components within normal limits  CBG MONITORING, ED - Abnormal; Notable for the following:    Glucose-Capillary 116 (*)    All other components within normal limits  URINALYSIS, ROUTINE W REFLEX MICROSCOPIC  HIV ANTIBODY (ROUTINE TESTING)  CBC  CREATININE, SERUM  TSH  TROPONIN I  TROPONIN I  TROPONIN I    EKG  EKG Interpretation  Date/Time:  Friday December 04 2016 11:58:44 EDT Ventricular Rate:  61 PR Interval:    QRS Duration: 164 QT Interval:  441 QTC Calculation: 445 R Axis:   -37 Text Interpretation:  Sinus rhythm Left bundle branch block No significant change since last tracing Confirmed by FLOYD MD, Reuel Boom ([id number]) on 12/04/2016  12:01:14 PM Also confirmed by Adela Lank MD, DANIEL [phone]), editor Joseph Art, Ivor Messier ([id number])  on 12/04/2016 12:26:32 PM       Radiology No results found.  Procedures Procedures (including critical care time)  Medications Ordered in ED Medications  sodium chloride flush (NS) 0.9 % injection 3 mL (not administered)  heparin injection 5,000 Units (not administered)  acetaminophen (TYLENOL) tablet 650 mg (not administered)    Or  acetaminophen (TYLENOL) suppository 650 mg (not administered)  traZODone (DESYREL) tablet 25 mg (not administered)  polyethylene glycol (MIRALAX / GLYCOLAX) packet 17 g (not administered)  ondansetron (ZOFRAN)  tablet 4 mg (not administered)    Or  ondansetron (ZOFRAN) injection 4 mg (not administered)  alum & mag hydroxide-simeth (MAALOX/MYLANTA) 200-200-20 MG/5ML suspension 30 mL (not administered)     Initial Impression / Assessment and Plan / ED Course  I have reviewed the triage vital signs and the nursing notes.  Pertinent labs & imaging results that were available during my care of the patient were reviewed by me and considered in my medical decision making (see chart for details).     56 yo man with PMH chronic combined systolic and diastolic heart failure presents to the ED after syncopal episode. He has a new murmur and abnormal heart sound on exam. He has a left bundle branch block.   [place] syncope rule he is not low risk for serious outcomes from syncope (CHF) and should be admitted for further workup and monitoring.   He has moderate hemoglobin on urinalysis, this will need further workup.   Final Clinical Impressions(s) / ED Diagnoses   Final diagnoses:  None    New Prescriptions New Prescriptions   No medications on file     Eulah Pont, MD 12/04/16 1518    Melene Plan, DO 12/04/16 1610

## 2016-12-04 NOTE — H&P (Signed)
History and Physical    KOURY RODDY ZOX:[id number] DOB: [birth year] DOA: 12/04/2016  PCP: Georgianne Fick, MD   Patient coming from: work  Chief Complaint: fainting  HPI: EDWING FIGLEY is a 56 y.o. male with medical history significant for NICM with EF 30-35%, akinesis of the entire inferior wall and inferior septal myocardium, grade 1 diastolic dysfunction by last Echo in 2016, old LBBB, GERD, HTN, anxiety and insomnia who presented to the ED after he sustained a syncope at work witnessed by his coworker  Patient has no c/o chest pain dizziness, SOB, palpitations, diaphoresis or unusual weakness, the only complained is reduced appetite He denied any confusion or weakness after coming around   ED Course: In the ED is VS demonstrated bradycardia with heart rate in the 50s, suboptimally controlled blood pressure fluctuating between 148/91 and 160/97 mmHg  Blood work showed normal BMP and down CBC showed anemia with hemoglobin 11.8 g/dL, but there is no recent data poor comparison except left known hemoglobin was 13.2 g/dL in January 2006  Chest x-ray didn't show any acute abnormality  EKG  showed Sinus rhythm, left bundle branch block  Review of Systems: As per HPI otherwise 10 point review of systems negative.   Ambulatory Status: independent   Past Medical History:  Diagnosis Date  . Anxiety   . Chronic diastolic heart failure (HCC)   . Hematuria   . Hypertension   . Insomnia   . LBBB (left bundle branch block)   . Nonischemic cardiomyopathy (HCC)    LVEF 35-40% by cath 01-13-08. Normal coronary arteries  . Reflux     Past Surgical History:  Procedure Laterality Date  . CARDIAC CATHETERIZATION  01/2008   clean Coronaries, HTN CM??; EF ~30-35%    Social History   Social History  . Marital status: Married    Spouse name: N/A  . Number of children: N/A  . Years of education: N/A   Occupational History  . Not on file.   Social History Main Topics  . Smoking  status: Never Smoker  . Smokeless tobacco: Never Used  . Alcohol use No  . Drug use: No  . Sexual activity: Not on file   Other Topics Concern  . Not on file   Social History Narrative   Works for the city   Physically very active job.   Has 2 summer jobs both of which are very physical   [place] a lot   Drinks ~ 24 Oz of caffeine in am   3 kids   Married   Lives in [place] [employer]   Never smoker    No Known Allergies  Family History  Problem Relation Age of Onset  . Hypertension Mother   . Hypertension Father   . CAD Maternal Aunt 50  . Coronary artery disease Paternal Uncle 60    Prior to Admission medications   Medication Sig Start Date End Date Taking? Authorizing Provider  aspirin EC 81 MG tablet Take 1 tablet (81 mg total) by mouth daily. 10/10/14   Elease Etienne, MD  carvedilol (COREG) 6.25 MG tablet TAKE 1 TABLET (6.25 MG TOTAL) BY MOUTH 2 (TWO) TIMES DAILY WITH A MEAL. 01/23/16   Lyn Records, MD  carvedilol (COREG) 6.25 MG tablet TAKE 1 TABLET BY MOUTH TWICE A DAY WITH MEALS 12/02/16   Lyn Records, MD  lisinopril (PRINIVIL,ZESTRIL) 10 MG tablet Take 1 tablet (10 mg total) by mouth daily. 12/12/15   Sherilyn Cooter  Malissa Hippo, MD  lisinopril (PRINIVIL,ZESTRIL) 10 MG tablet Take 1 tablet (10 mg total) by mouth daily. 01/20/16   Lyn Records, MD  nitroGLYCERIN (NITROSTAT) 0.4 MG SL tablet Place 0.4 mg under the tongue every 5 (five) minutes as needed for chest pain.    Historical Provider, MD  nitroGLYCERIN (NITROSTAT) 0.4 MG SL tablet Place 1 tablet (0.4 mg total) under the tongue every 5 (five) minutes as needed for chest pain. 05/11/16   Lyn Records, MD    Physical Exam: Vitals:   12/04/16 1200 12/04/16 1201 12/04/16 1215  BP: (!) 160/97  (!) 148/91  Pulse: (!) 57  (!) 56  Resp: (!) 21  16  TempSrc: Oral    SpO2: 100%  99%  Weight:  74.8 kg (165 lb)   Height:  5\' 11"  (1.803 m)      General: Appears calm and comfortable Eyes: PERRLA, EOMI, normal lids,  iris ENT:  grossly normal hearing, lips & tongue, mucous membranes moist and intact Neck: no lymphoadenopathy, masses or thyromegaly Cardiovascular: RRR, no m/r/g. No JVD, carotid bruits. No LE edema.  Respiratory: bilateral no wheezes, rales, rhonchi or cracles. Normal respiratory effort. No accessory muscle use observed Abdomen: soft, non-tender, non-distended, no organomegaly or masses appreciated. BS present in all quadrants Skin: no rash, ulcers or induration seen on limited exam Musculoskeletal: grossly normal tone BUE/BLE, good ROM, no bony abnormality or joint deformities observed Psychiatric: grossly normal mood and affect, speech fluent and appropriate, alert and oriented x3 Neurologic: CN II-XII grossly intact, moves all extremities in coordinated fashion, sensation intact  Labs on Admission: I have personally reviewed following labs and imaging studies  CBC, BMP  GFR: CrCl cannot be calculated (Patient's most recent lab result is older than the maximum 21 days allowed.).   Creatinine Clearance: CrCl cannot be calculated (Patient's most recent lab result is older than the maximum 21 days allowed.).    Radiological Exams on Admission: No results found.  EKG: Independently reviewed - SR, LBBB Assessment/Plan Principal Problem:   Syncope and collapse Active Problems:   Essential hypertension, benign   Nonischemic cardiomyopathy (HCC)   Chronic diastolic heart failure (HCC)   Anxiety   Syncope - cariogenic vs vasovagal vs dehydration Orthostatic VS were negative Continue to monitor on telemetry for any arrhythmia currently telemetry shows HR in low 50's Refresh Echo, cycle cardiac enzymes Serum glucose on arrival was 100 mg/dL Cardiology consult was requested by EDP Will give a small bolus of NS  Hypertension - currently stable Continue home medication and adjust the doses if needed depending on the BP readings  NICM with EF 30-35% and combined systolic and  diastolic CHF - no signs of CHF on physical exam Rerfresh Echo, continue home meds  GERD Stable, no complaints Mylanta prn ordered  DVT prophylaxis: Heparin Code Status: full Family Communication: At bedside Disposition Plan: telemetry Consults called: cardiology by EDP Admission status: observation   Raymon Mutton, PA-C Pager: [phone] [facility]  If 7PM-7AM, please contact night-coverage [email or web address] Password [facility]  12/04/2016, 1:15 PM   I saw and evaluated the patient, I personally took the key portions of the history and physical examination, I reviewed with the history and physical from [facility], and agree with her medical decision making. Further comments are as follows.  This is a 56 year old male with nonischemic cardiomyopathy with left ventricular ejection fraction of 25% in 2016 with a left bundle branch block, who presented with a syncope episode. He was  working in the yard, pushing a machinery at a fast pace for about an hour when suddenly he felt dizzy and lost his consciousness. A coworker witnessed the event, no tonic-clonic movements or loss of continence, patient recovered his consciousness 2 minutes later with no post event somnolence, lethargy or confusion. Otherwise patient is physically functional, last evaluation by electrophysiology was in April 2017, then no indication for ICD or  CRT was found due to patient lack of symptoms. Physical examination temperature 90.2, blood pressure 135/74, heart rate 54, respiratory rate 18, negative orthostatics. His lungs are clear to auscultation, heart S1-S2 present rhythmic, abdomen soft, extremities no edema. Sodium 139, potassium 4.0, chloride 6, creatinine 6, glucose 100, BUN 9, creatinine 1.12, calcium 9.1, white count 7.5, hemoglobin 11.8, hematocrit 35.3, platelets 170. Urinalysis negative for infection. EKG with normal sinus rhythm, left axis deviation, left bundle branch block.   Working  diagnosis syncope, rule out cardiogenic origin.  1. Syncope. Patient has significant risk for ventricular arrhythmias due to his low systolic ejection fraction of the left ventricle and left bundle branch block. Probably patient will need further workup including a stress test, Holter monitoring, and possible EP referral. Will follow up with cardiology recommendations.   2. Heart failure, nonischemic cardiomyopathy. Continue carvedilol, lisinopril. Clinically euvolemic. NYHA class 2.

## 2016-12-04 NOTE — ED Notes (Signed)
Gave pt urinal so he could attempt to provide a urine sample.

## 2016-12-04 NOTE — ED Triage Notes (Signed)
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 Episode was witnessed by a coworker. Denies any injury. No neuro deficits noted. Pt woke up and ate something after the episodes. Reports having low appetite this morning.   Vitals per EMS: 150/82 Manual, 18 RR, 146 CBG, 60 HR.

## 2016-12-04 NOTE — Telephone Encounter (Signed)
Pt reporting episode of dizziness followed by syncopal event around 9:30 this morning.  Episode lasted about 2-3 minutes. No seizure activity noted.  Pt denies any injury related to event.  Reported working in the yard, walking behind an Occupational psychologist, got dizzy and then passed out.   Pt did not have breakfast - but he doesn't normally eat breakfast. Recent flu 3 weeks ago. Pt reports being at [facility] (at PCP) who is having EMS take him to the hospital for evaluation.  He wants Dr. [name] aware.  Informed patient that we would forward information to Dr. [name] and his nurse. Pt is appreciative.

## 2016-12-04 NOTE — Telephone Encounter (Signed)
New message   Pt c/o Syncope: STAT if syncope occurred within 30 minutes and pt complains of lightheadedness High Priority if episode of passing out, completely, today or in last 24 hours   1. Did you pass out today? 9:30, yes  2. When is the last time you passed out? 9:30am 12/04/16  3. Has this occurred multiple times? no  4. Did you have any symptoms prior to passing out? no

## 2016-12-04 NOTE — ED Notes (Signed)
Pt being transported upstairs by Amy, NT  

## 2016-12-04 NOTE — Telephone Encounter (Signed)
Agree 

## 2016-12-05 ENCOUNTER — Encounter (HOSPITAL_COMMUNITY): Payer: Self-pay | Admitting: Physician Assistant

## 2016-12-05 ENCOUNTER — Observation Stay (HOSPITAL_COMMUNITY): Payer: BC Managed Care – PPO

## 2016-12-05 DIAGNOSIS — R7989 Other specified abnormal findings of blood chemistry: Secondary | ICD-10-CM

## 2016-12-05 DIAGNOSIS — R55 Syncope and collapse: Secondary | ICD-10-CM | POA: Diagnosis not present

## 2016-12-05 DIAGNOSIS — I5022 Chronic systolic (congestive) heart failure: Secondary | ICD-10-CM

## 2016-12-05 DIAGNOSIS — I10 Essential (primary) hypertension: Secondary | ICD-10-CM | POA: Diagnosis not present

## 2016-12-05 LAB — CBC
HCT: 34.6 % — ABNORMAL LOW (ref 39.0–52.0)
Hemoglobin: 11.3 g/dL — ABNORMAL LOW (ref 13.0–17.0)
MCH: 30.8 pg (ref 26.0–34.0)
MCHC: 32.7 g/dL (ref 30.0–36.0)
MCV: 94.3 fL (ref 78.0–100.0)
Platelets: 164 10*3/uL (ref 150–400)
RBC: 3.67 MIL/uL — ABNORMAL LOW (ref 4.22–5.81)
RDW: 12.8 % (ref 11.5–15.5)
WBC: 6.5 10*3/uL (ref 4.0–10.5)

## 2016-12-05 LAB — COMPREHENSIVE METABOLIC PANEL
ALT: 18 U/L (ref 17–63)
AST: 20 U/L (ref 15–41)
Albumin: 3.1 g/dL — ABNORMAL LOW (ref 3.5–5.0)
Alkaline Phosphatase: 60 U/L (ref 38–126)
Anion gap: 9 (ref 5–15)
BUN: 13 mg/dL (ref 6–20)
CO2: 26 mmol/L (ref 22–32)
Calcium: 8.8 mg/dL — ABNORMAL LOW (ref 8.9–10.3)
Chloride: 103 mmol/L (ref 101–111)
Creatinine, Ser: 1.33 mg/dL — ABNORMAL HIGH (ref 0.61–1.24)
GFR calc Af Amer: >60 mL/min (ref >60)
GFR calc non Af Amer: 58 mL/min — ABNORMAL LOW (ref >60)
Glucose, Bld: 107 mg/dL — ABNORMAL HIGH (ref 65–99)
Potassium: 3.8 mmol/L (ref 3.5–5.1)
Sodium: 138 mmol/L (ref 135–145)
Total Bilirubin: 0.6 mg/dL (ref 0.3–1.2)
Total Protein: 6.3 g/dL — ABNORMAL LOW (ref 6.5–8.1)

## 2016-12-05 LAB — GLUCOSE, CAPILLARY: Glucose-Capillary: 89 mg/dL (ref 65–99)

## 2016-12-05 LAB — HIV ANTIBODY (ROUTINE TESTING W REFLEX): HIV Screen 4th Generation wRfx: NONREACTIVE

## 2016-12-05 LAB — TROPONIN I: Troponin I: <0.03 ng/mL (ref <0.03)

## 2016-12-05 NOTE — Progress Notes (Signed)
Spoke with Dennis Bast - need echo result from this admission before I can submit complete documentation for [facility]. Did put in care management consult request to help get the ball rolling.  Glade Strausser PA-C

## 2016-12-05 NOTE — Consult Note (Signed)
Cardiology Consultation Note    Patient ID: Jeremy Wagner, MRN: [id number], DOB/AGE: 11-15-1960 56 y.o. Admit date: 12/04/2016   Date of Consult: 12/05/2016 Primary Physician: Georgianne Fick, MD Primary Cardiologist: Dr. Katy Apo - previously saw Dr. Graciela Husbands  Chief Complaint: passed out Reason for Consultation: syncope Requesting MD: Dr. Rito Ehrlich  HPI: Jeremy Wagner is a 56 y.o. male with history of NICM, HTN, LBBB, reflux, anxiety whom we are asked to see for syncope. He has longstanding history of NICM back to 2009 when cath showed EF 35-40%, normal coronary arteries. Last echo 09/2014 showed EF 30-35%, akinesis of entire inferoseptal myocardium, entire inferior myocardium, apicalanterior myocardium, mid anterolateral myocardium, grade 1 DD, mild PR, LV strain abnormal at -11.4%. He was previously evaluated by Dr. Graciela Husbands in 2017 at which time he was asymptomatic and not felt to require ICD/CRT at that time. LBBB duration has been slowly increasing over the years. His paternal aunt died around her 14s of some sort of heart disease but details are not clear.  He's been minimally symptomatic from his CHF over the years, no recent admissions. 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 However, all of a sudden when he went to turn around, he suddenly felt dizzy and the next thing he knew he was on the ground and his coworker was shaking his leg asking if he was OK. The aerator had fallen down the hill. He felt totally fine afterwards. No seizure activity or b/b incontinence reported. He denies any acute injury sustained by this. He had the flu 3 weeks ago. No recent CP, palpitations, dyspnea, orthopnea or edema. He went to his PCP and was referred to the ER for further evaluation. Labs show Cr 1.1-1.3 (similar to prior), LFTs OK except albumin 3.1, troponins neg, Hgb 11.8, UA + nitrite, mod Hgb, small leuks, TSH wnl.  Upon admission he was hypertensive at 160/97. Orthostatics negative, BP 135/74 -> 144/92 with standing, HR 54->68. Telemetry NSR/SB low 50s, no VT, ectopy or VF.  Past Medical History:  Diagnosis Date  . Anxiety   . Chronic systolic CHF (congestive heart failure) (HCC)   . GERD (gastroesophageal reflux disease)   . Heart murmur   . Hematuria   . Hypertension   . Insomnia   . LBBB (left bundle branch block)   . Nonischemic cardiomyopathy (HCC)    a. LVEF 35-40% by cath 01-13-08. Normal coronary arteries. b. 2D echo 2016: EF 30-35%.      Surgical History:  Past Surgical History:  Procedure Laterality Date  . CARDIAC CATHETERIZATION  01/2008   clean Coronaries, HTN CM??; EF ~30-35%     Home Meds: Prior to Admission medications   Medication Sig Start Date End Date Taking? Authorizing Provider  aspirin EC 81 MG tablet Take 1 tablet (81 mg total) by mouth daily. 10/10/14  Yes Elease Etienne, MD  carvedilol (COREG) 6.25 MG tablet TAKE 1 TABLET BY MOUTH TWICE A DAY WITH MEALS 12/02/16  Yes Lyn Records, MD  lisinopril (PRINIVIL,ZESTRIL) 10 MG tablet Take 1 tablet (10 mg total) by mouth daily. 12/12/15  Yes Lyn Records, MD  nitroGLYCERIN (NITROSTAT) 0.4 MG SL tablet Place 1 tablet (0.4 mg total) under the tongue every 5 (five) minutes as needed for chest pain. 05/11/16  Yes Lyn Records, MD    Inpatient Medications:  . aspirin EC  81 mg Oral Daily  .  carvedilol  6.25 mg Oral BID WC  . heparin  5,000 Units Subcutaneous Q8H  . lisinopril  10 mg Oral Daily  . sodium chloride flush  3 mL Intravenous Q12H     Allergies: No Known Allergies  Social History   Social History  . Marital status: Married    Spouse name: N/A  . Number of children: N/A  . Years of education: N/A   Occupational History  . Not on file.   Social History Main Topics  . Smoking status: Never Smoker  . Smokeless tobacco: Never Used  . Alcohol use No  . Drug use: No  . Sexual activity: Not on file    Other Topics Concern  . Not on file   Social History Narrative   Works for the city   Physically very active job.   Has 2 summer jobs both of which are very physical   [place] a lot   Drinks ~ 24 Oz of caffeine in am   3 kids   Married   Lives in [place] [employer]   Never smoker     Family History  Problem Relation Age of Onset  . Hypertension Mother   . Hypertension Father   . CAD Maternal Aunt 50  . Coronary artery disease Paternal Uncle 60     Review of Systems:  All other systems reviewed and are otherwise negative except as noted above.  Labs:  Recent Labs  12/04/16 1424 12/04/16 1956 12/05/16 0117  TROPONINI <0.03 <0.03 <0.03   Lab Results  Component Value Date   WBC 6.5 12/05/2016   HGB 11.3 (L) 12/05/2016   HCT 34.6 (L) 12/05/2016   MCV 94.3 12/05/2016   PLT 164 12/05/2016    Recent Labs Lab 12/05/16 0117  NA 138  K 3.8  CL 103  CO2 26  BUN 13  CREATININE 1.33*  CALCIUM 8.8*  PROT 6.3*  BILITOT 0.6  ALKPHOS 60  ALT 18  AST 20  GLUCOSE 107*    Radiology/Studies:  No results found.  Wt Readings from Last 3 Encounters:  12/05/16 162 lb 1.6 oz (73.5 kg)  12/27/15 162 lb 3.2 oz (73.6 kg)  12/20/15 166 lb 12.8 oz (75.7 kg)    EKG: NSR 61bpm LBBB QRS  Physical Exam: Blood pressure 131/76, pulse (!) 58, temperature 98.1 F (36.7 C), temperature source Oral, resp. rate 18, height 5\' 11"  (1.803 m), weight 162 lb 1.6 oz (73.5 kg), SpO2 98 %. Body mass index is 22.61 kg/m. General: Well developed, well nourished M, in no acute distress. Head: Normocephalic, atraumatic, sclera non-icteric, no xanthomas, nares are without discharge.  Neck: Negative for carotid bruits. JVD not elevated. Lungs: Clear bilaterally to auscultation without wheezes, rales, or rhonchi. Breathing is unlabored. Heart: RRR with S1 S2. No murmurs, rubs, or gallops appreciated. Abdomen: Soft, non-tender, non-distended with normoactive bowel sounds. No  hepatomegaly. No rebound/guarding. No obvious abdominal masses. Msk:  Strength and tone appear normal for age. Extremities: No clubbing or cyanosis. No edema.  Distal pedal pulses are 2+ and equal bilaterally. Neuro: Alert and oriented X 3. No facial asymmetry. No focal deficit. Moves all extremities spontaneously. Psych:  Responds to questions appropriately with a normal affect.     Assessment and Plan  15M with longstanding  NICM (normal cors 2009), HTN, LBBB, reflux, anxiety whom we are asked to see for syncope. Last EF 30-35% in 2016.  1. Syncope - certainly concerning in setting of low  EF and LBBB. May need to consider ICD +/- CRT. Kazuki Ingle review further with Dr. Elberta Fortis.  2. NICM/chronic systolic CHF - appears euvolemic. Has had minimal issues related to this over the years. Continue ACEI and BB. Updated echo pending.  3. HTN - could consider advancement of his HF therapy regimen but with recent syncope may need to be cautious. Unable to push beta blocker further given baseline borderline HR.  4. LBBB - chronic for patient.   5. Incidental mild anemia, abnormal UA - per internal medicine.  Signed, Laurann Montana PA-C 12/05/2016, 12:05 PM Pager: [phone]  I have seen and examined this patient with Ronie Spies.  Agree with above, note added to reflect my findings.  On exam, regular rhythm, no murmurs, lungs clear. 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 He says that he was using an airator on the side of a hill, had an episode of dizziness a few seconds prior to his syncope, and went down after that. When he awoke, he felt well with a little bit of weakness, but no post ictal state. He presented to the hospital after being seen by his primary physician. He had orthostatics done in the emergency room which were negative. Telemetry showed no evidence of VT, and an EKG was stable with sinus rhythm and a wide left bundle branch block. He has a history of a  nonischemic cardiomyopathy with an EF of 30-35%. It is possible that his episode of syncope was VT related. I discussed with him the option of defibrillator placement. Risks and benefits were discussed. Risks include bleeding, tamponade, infection, and pneumothorax, among others. He says that he would like to think about the possibility of defibrillator and speak with his primary cardiologist, Dr. [name], about this. Jheri Mitter try to arrange a LifeVest to be placed. Once the LifeVest has been placed, he Deneene Tarver be able to be discharged from the hospital. He has follow-up artery scheduled with Dr. [name].  Korri Ask M. Ileene Allie MD 12/05/2016 12:28 PM

## 2016-12-05 NOTE — Progress Notes (Addendum)
[facility] PROGRESS NOTE  Jeremy Wagner ZOX:[id number] DOB: 1960-12-07 DOA: 12/04/2016  PCP: Georgianne Fick, MD  Brief History/Interval Summary: 56 year old African-American male with a past medical history of nonischemic cardiomyopathy with an ejection fraction of 30-35% based on echocardiogram done in 2016. Apparently had a cardiac catheterization in 2009, which revealed normal coronary arteries. He also underwent stress test in 2016, which did not reveal any ischemia. He presented after sudden episode of syncope while he was at work. Due to his cardiac history patient was hospitalized for further evaluation.  Reason for Visit: Syncope  Consultants: Cardiology.  Procedures: Transthoracic echocardiogram is pending  Antibiotics: None  Subjective/Interval History: Patient denies any complaints this morning. No chest pain, shortness of breath, dizziness or lightheadedness. No leg swelling. No nausea or vomiting.  ROS: Denies any headaches.  Objective:  Vital Signs  Vitals:   12/04/16 1415 12/04/16 1439 12/04/16 2032 12/05/16 0602  BP: 127/86 135/74 135/75 131/76  Pulse: 61 (!) 54 68 (!) 58  Resp: 15 18 18 18   Temp:  98.2 F (36.8 C) 98 F (36.7 C) 98.1 F (36.7 C)  TempSrc:  Oral Oral Oral  SpO2: 98% 99% 98% 98%  Weight:  74.7 kg (164 lb 11.2 oz)  73.5 kg (162 lb 1.6 oz)  Height:  5\' 11"  (1.803 m)      Intake/Output Summary (Last 24 hours) at 12/05/16 1250 Last data filed at 12/05/16 0800  Gross per 24 hour  Intake              240 ml  Output                0 ml  Net              240 ml   Filed Weights   12/04/16 1201 12/04/16 1439 12/05/16 0602  Weight: 74.8 kg (165 lb) 74.7 kg (164 lb 11.2 oz) 73.5 kg (162 lb 1.6 oz)    General appearance: alert, cooperative, appears stated age and no distress Resp: clear to auscultation bilaterally Cardio: regular rate and rhythm, S1, S2 normal, no murmur, click, rub or gallop GI: soft, non-tender; bowel sounds  normal; no masses,  no organomegaly Extremities: extremities normal, atraumatic, no cyanosis or edema Neurologic: No focal deficits  Telemetry does not show any arrhythmias.  Lab Results:  Data Reviewed: I have personally reviewed following labs and imaging studies  CBC:  Recent Labs Lab 12/04/16 1250 12/04/16 1424 12/05/16 0117  WBC 7.5 7.2 6.5  HGB 11.8* 11.8* 11.3*  HCT 35.3* 35.5* 34.6*  MCV 93.1 93.4 94.3  PLT 170 172 164    Basic Metabolic Panel:  Recent Labs Lab 12/04/16 1250 12/04/16 1424 12/05/16 0117  NA 139  --  138  K 4.0  --  3.8  CL 106  --  103  CO2 26  --  26  GLUCOSE 100*  --  107*  BUN 9  --  13  CREATININE 1.12 1.10 1.33*  CALCIUM 9.1  --  8.8*    GFR: Estimated Creatinine Clearance: 64.5 mL/min (A) (by C-G formula based on SCr of 1.33 mg/dL (H)).  Liver Function Tests:  Recent Labs Lab 12/05/16 0117  AST 20  ALT 18  ALKPHOS 60  BILITOT 0.6  PROT 6.3*  ALBUMIN 3.1*    Cardiac Enzymes:  Recent Labs Lab 12/04/16 1424 12/04/16 1956 12/05/16 0117  TROPONINI <0.03 <0.03 <0.03    CBG:  Recent Labs Lab 12/04/16 1219 12/05/16 0606  GLUCAP 116* 89    Thyroid Function Tests:  Recent Labs  12/04/16 1424  TSH 1.151     Radiology Studies: No results found.   Medications:  Scheduled: . aspirin EC  81 mg Oral Daily  . carvedilol  6.25 mg Oral BID WC  . heparin  5,000 Units Subcutaneous Q8H  . lisinopril  10 mg Oral Daily  . sodium chloride flush  3 mL Intravenous Q12H   Continuous:  OLM:[id number] **OR** acetaminophen, alum & mag hydroxide-simeth, nitroGLYCERIN, ondansetron **OR** ondansetron (ZOFRAN) IV, polyethylene glycol, traZODone  Assessment/Plan:  Principal Problem:   Syncope and collapse Active Problems:   Essential hypertension, benign   LBBB (left bundle branch block)   Nonischemic cardiomyopathy (HCC)   Chronic systolic heart failure (HCC)   Anxiety    Syncope In the setting of  nonischemic cardiomyopathy with ejection fraction of 30-35%. Patient's EKG shows LBBB which is old for him. Previously seen by electrophysiologist in 2016. At that time was not thought to require ICD. Patient hasn't had any arrhythmias on telemetry. Echocardiogram has been ordered. We'll also get chest x-ray. Due to his cardiac history and syncope being very suspicious for cardiac arrhythmia, cardiology was consulted.  Nonischemic cardiomyopathy. Appears to be well compensated at this time. Continue home medications including beta blockers and ACE inhibitor.  History of essential hypertension. Stable. Continue home medications.  Mildly elevated creatinine. Does not appear to be volume depleted. We will not give him any more IV fluids. Labs will be repeated tomorrow if he is still here. Otherwise, this can be repeated in the outpatient setting. Mildly abnormal UA with leukocytes and nitrite. However, patient denies any symptoms suggestive of UTI. No need for treatment.  DVT Prophylaxis: Subcutaneous heparin    Code Status: Full code  Family Communication: Discussed with the patient and his wife  Disposition Plan: Await cardiology input. Management as outlined above.    LOS: 0 days   [facility]  [facility] Pager [phone] 12/05/2016, 12:50 PM  If 7PM-7AM, please contact night-coverage at [email or web address], password [facility]

## 2016-12-06 ENCOUNTER — Observation Stay (HOSPITAL_BASED_OUTPATIENT_CLINIC_OR_DEPARTMENT_OTHER): Payer: BC Managed Care – PPO

## 2016-12-06 DIAGNOSIS — R55 Syncope and collapse: Secondary | ICD-10-CM

## 2016-12-06 LAB — CBC
HCT: 36.6 % — ABNORMAL LOW (ref 39.0–52.0)
Hemoglobin: 12.1 g/dL — ABNORMAL LOW (ref 13.0–17.0)
MCH: 30.9 pg (ref 26.0–34.0)
MCHC: 33.1 g/dL (ref 30.0–36.0)
MCV: 93.6 fL (ref 78.0–100.0)
Platelets: 178 10*3/uL (ref 150–400)
RBC: 3.91 MIL/uL — ABNORMAL LOW (ref 4.22–5.81)
RDW: 12.6 % (ref 11.5–15.5)
WBC: 6.3 10*3/uL (ref 4.0–10.5)

## 2016-12-06 LAB — BASIC METABOLIC PANEL
Anion gap: 8 (ref 5–15)
BUN: 12 mg/dL (ref 6–20)
CO2: 27 mmol/L (ref 22–32)
Calcium: 9.1 mg/dL (ref 8.9–10.3)
Chloride: 103 mmol/L (ref 101–111)
Creatinine, Ser: 1.21 mg/dL (ref 0.61–1.24)
GFR calc Af Amer: >60 mL/min (ref >60)
GFR calc non Af Amer: >60 mL/min (ref >60)
Glucose, Bld: 96 mg/dL (ref 65–99)
Potassium: 4 mmol/L (ref 3.5–5.1)
Sodium: 138 mmol/L (ref 135–145)

## 2016-12-06 LAB — GLUCOSE, CAPILLARY: Glucose-Capillary: 102 mg/dL — ABNORMAL HIGH (ref 65–99)

## 2016-12-06 LAB — ECHOCARDIOGRAM COMPLETE
Height: 71 in
Weight: 2584 oz

## 2016-12-06 NOTE — Progress Notes (Addendum)
Progress Note  Patient Name: Jeremy Wagner Date of Encounter: 12/06/2016  Primary Cardiologist: [name]  Subjective   Feeling well, no CP, SOB, Syncope  Inpatient Medications    Scheduled Meds: . aspirin EC  81 mg Oral Daily  . carvedilol  6.25 mg Oral BID WC  . heparin  5,000 Units Subcutaneous Q8H  . lisinopril  10 mg Oral Daily  . sodium chloride flush  3 mL Intravenous Q12H   Continuous Infusions:  PRN Meds: acetaminophen **OR** acetaminophen, alum & mag hydroxide-simeth, nitroGLYCERIN, ondansetron **OR** ondansetron (ZOFRAN) IV, polyethylene glycol, traZODone   Vital Signs    Vitals:   12/05/16 0602 12/05/16 1344 12/05/16 2003 12/06/16 0438  BP: 131/76 (!) 146/81 117/69 111/77  Pulse: (!) 58 70 63 62  Resp: 18 20 18 16   Temp: 98.1 F (36.7 C) 98.2 F (36.8 C) 98.7 F (37.1 C) 98.4 F (36.9 C)  TempSrc: Oral Oral Oral Oral  SpO2: 98% 100% 99% 98%  Weight: 162 lb 1.6 oz (73.5 kg)   161 lb 8 oz (73.3 kg)  Height:        Intake/Output Summary (Last 24 hours) at 12/06/16 1044 Last data filed at 12/06/16 0800  Gross per 24 hour  Intake              840 ml  Output                0 ml  Net              840 ml   Filed Weights   12/04/16 1439 12/05/16 0602 12/06/16 0438  Weight: 164 lb 11.2 oz (74.7 kg) 162 lb 1.6 oz (73.5 kg) 161 lb 8 oz (73.3 kg)    Telemetry    Sinus rhythm - Personally Reviewed  ECG    Sinus rhythm, LBBB - Personally Reviewed  Physical Exam   GEN: No acute distress.   Neck: No JVD Cardiac: RRR, no murmurs, rubs, or gallops.  Respiratory: Clear to auscultation bilaterally. GI: Soft, nontender, non-distended  MS: No edema; No deformity. Neuro:  Nonfocal  Psych: Normal affect   Labs    Chemistry Recent Labs Lab 12/04/16 1250 12/04/16 1424 12/05/16 0117 12/06/16 0155  NA 139  --  138 138  K 4.0  --  3.8 4.0  CL 106  --  103 103  CO2 26  --  26 27  GLUCOSE 100*  --  107* 96  BUN 9  --  13 12  CREATININE 1.12 1.10  1.33* 1.21  CALCIUM 9.1  --  8.8* 9.1  PROT  --   --  6.3*  --   ALBUMIN  --   --  3.1*  --   AST  --   --  20  --   ALT  --   --  18  --   ALKPHOS  --   --  60  --   BILITOT  --   --  0.6  --   GFRNONAA >60 >60 58* >60  GFRAA >60 >60 >60 >60  ANIONGAP 7  --  9 8     Hematology Recent Labs Lab 12/04/16 1424 12/05/16 0117 12/06/16 0155  WBC 7.2 6.5 6.3  RBC 3.80* 3.67* 3.91*  HGB 11.8* 11.3* 12.1*  HCT 35.5* 34.6* 36.6*  MCV 93.4 94.3 93.6  MCH 31.1 30.8 30.9  MCHC 33.2 32.7 33.1  RDW 12.5 12.8 12.6  PLT 172 164 178  Cardiac Enzymes Recent Labs Lab 12/04/16 1424 12/04/16 1956 12/05/16 0117  TROPONINI <0.03 <0.03 <0.03   No results for input(s): TROPIPOC in the last 168 hours.   BNPNo results for input(s): BNP, PROBNP in the last 168 hours.   DDimer No results for input(s): DDIMER in the last 168 hours.   Radiology    Dg Chest 2 View  Result Date: 12/05/2016 CLINICAL DATA:  56 year old male with a history of syncope EXAM: CHEST  2 VIEW COMPARISON:  10/09/2014 FINDINGS: Cardiomediastinal silhouette within normal limits. No evidence of central vascular congestion. No pneumothorax or pleural effusion.  No confluent airspace disease. No displaced fracture. IMPRESSION: No radiographic evidence of acute cardiopulmonary disease Electronically Signed   By: Gilmer Mor D.O.   On: 12/05/2016 13:54    Cardiac Studies   2016 TTE - Left ventricle: LV strain is abnormal at -11.4% The cavity size was mildly dilated. Systolic function was moderately to severely reduced. The estimated ejection fraction was in the range of 30% to 35%. There is akinesis of the entireinferoseptal myocardium. There is akinesis of the entireinferior myocardium. There is akinesis of the apicalanterior myocardium. There is akinesis of the midanterolateral myocardium. There was an increased relative contribution of atrial contraction to ventricular filling. Doppler parameters are  consistent with abnormal left ventricular relaxation (grade 1 diastolic dysfunction). - Ventricular septum: Septal motion showed paradox. These changes are consistent with a bundle branch block. - Aortic valve: Mildly calcified leaflets. There was trivial regurgitation. - Pulmonic valve: There was mild regurgitation.  Patient Profile     56 y.o. male  with history of NICM, HTN, LBBB, reflux, anxiety whom we are asked to see for syncope. He has longstanding history of NICM back to 2009 when cath showed EF 35-40%, normal coronary arteries. Presented to the hospital with an episode of syncope.  Assessment & Plan    1. Syncope - certainly concerning in setting of low EF and LBBB. Would recommend an ICD for likely secondary prevention but the patient would like to discuss with his primary cardiologist. Working on life vest fitting as his episode of syncope is worrisome for ventricular tachycardia and would be wary of discharge without secondary prevention..  2. NICM/chronic systolic CHF - appears euvolemic. Has had minimal issues related to this over the years. Continue ACEI and BB. Updated echo pending.  3. HTN - well controlled today  4. LBBB - chronic for patient.   5. Incidental mild anemia, abnormal UA - per internal medicine.  Signed, Demetria Iwai Jorja Loa, MD  12/06/2016, 10:44 AM

## 2016-12-06 NOTE — Progress Notes (Signed)
I've been in contact with Dennis Bast, point of contact at our office, regarding status of Lifevest. Faxed in updated echo report and progress note from today. (I already faxed in the order and all the necessary paperwork otherwise yesterday.) Reviewed updated LV function with Dr. Elberta Fortis.   Per Tresa Endo, [facility] has notified her that they will not make a decision until tomorrow. Tresa Endo said she would keep care management informed. I will be off tomorrow but the EP team will follow him. I spoke with the patient to make him aware. He is agreeable to staying, states he understands the need. I also updated Dr. Rito Ehrlich of this. Millenia Waldvogel PA-C

## 2016-12-06 NOTE — Progress Notes (Signed)
[facility] PROGRESS NOTE  Jeremy Wagner ZDG:[id number] DOB: [birth year] DOA: 12/04/2016  PCP: Georgianne Fick, MD  Brief History/Interval Summary: 56 year old African-American male with a past medical history of nonischemic cardiomyopathy with an ejection fraction of 30-35% based on echocardiogram done in 2016. Apparently had a cardiac catheterization in 2009, which revealed normal coronary arteries. He also underwent stress test in 2016, which did not reveal any ischemia. He presented after sudden episode of syncope while he was at work. Due to his cardiac history patient was hospitalized for further evaluation.  Reason for Visit: Syncope  Consultants: Cardiology.  Procedures:  Transthoracic echocardiogram Study Conclusions  - Left ventricle: The cavity size was normal. Wall thickness was   increased in a pattern of mild LVH. Systolic function was   severely reduced. The estimated ejection fraction was in the   range of 20% to 25%. Diffuse hypokinesis. Doppler parameters are   consistent with abnormal left ventricular relaxation (grade 1   diastolic dysfunction). - Regional wall motion abnormality: Akinesis of the mid anterior   and basal-mid anteroseptal myocardium. - Aortic valve: Mildly calcified annulus. Trileaflet; normal   thickness leaflets. Valve area (VTI): 1.69 cm^2. Valve area   (Vmax): 1.93 cm^2. Valve area (Vmean): 1.83 cm^2. - Left atrium: The atrium was mildly dilated. - Atrial septum: No defect or patent foramen ovale was identified. - Technically adequate study.  Antibiotics: None  Subjective/Interval History: Patient feels well. Denies any complaints.   ROS: Denies any headaches.  Objective:  Vital Signs  Vitals:   12/05/16 0602 12/05/16 1344 12/05/16 2003 12/06/16 0438  BP: 131/76 (!) 146/81 117/69 111/77  Pulse: (!) 58 70 63 62  Resp: 18 20 18 16   Temp: 98.1 F (36.7 C) 98.2 F (36.8 C) 98.7 F (37.1 C) 98.4 F (36.9 C)  TempSrc:  Oral Oral Oral Oral  SpO2: 98% 100% 99% 98%  Weight: 73.5 kg (162 lb 1.6 oz)   73.3 kg (161 lb 8 oz)  Height:        Intake/Output Summary (Last 24 hours) at 12/06/16 1213 Last data filed at 12/06/16 0800  Gross per 24 hour  Intake              840 ml  Output                0 ml  Net              840 ml   Filed Weights   12/04/16 1439 12/05/16 0602 12/06/16 0438  Weight: 74.7 kg (164 lb 11.2 oz) 73.5 kg (162 lb 1.6 oz) 73.3 kg (161 lb 8 oz)    General appearance: alert, cooperative, appears stated age and no distress Resp: clear to auscultation bilaterally Cardio: regular rate and rhythm, S1, S2 normal, no murmur, click, rub or gallop GI: soft, non-tender; bowel sounds normal; no masses,  no organomegaly Extremities: extremities normal, atraumatic, no cyanosis or edema Neurologic: No focal deficits  Telemetry does not show any arrhythmias.  Lab Results:  Data Reviewed: I have personally reviewed following labs and imaging studies  CBC:  Recent Labs Lab 12/04/16 1250 12/04/16 1424 12/05/16 0117 12/06/16 0155  WBC 7.5 7.2 6.5 6.3  HGB 11.8* 11.8* 11.3* 12.1*  HCT 35.3* 35.5* 34.6* 36.6*  MCV 93.1 93.4 94.3 93.6  PLT 170 172 164 178    Basic Metabolic Panel:  Recent Labs Lab 12/04/16 1250 12/04/16 1424 12/05/16 0117 12/06/16 0155  NA 139  --  138 138  K 4.0  --  3.8 4.0  CL 106  --  103 103  CO2 26  --  26 27  GLUCOSE 100*  --  107* 96  BUN 9  --  13 12  CREATININE 1.12 1.10 1.33* 1.21  CALCIUM 9.1  --  8.8* 9.1    GFR: Estimated Creatinine Clearance: 70.7 mL/min (by C-G formula based on SCr of 1.21 mg/dL).  Liver Function Tests:  Recent Labs Lab 12/05/16 0117  AST 20  ALT 18  ALKPHOS 60  BILITOT 0.6  PROT 6.3*  ALBUMIN 3.1*    Cardiac Enzymes:  Recent Labs Lab 12/04/16 1424 12/04/16 1956 12/05/16 0117  TROPONINI <0.03 <0.03 <0.03    CBG:  Recent Labs Lab 12/04/16 1219 12/05/16 0606 12/06/16 0600  GLUCAP 116* 89 102*     Thyroid Function Tests:  Recent Labs  12/04/16 1424  TSH 1.151     Radiology Studies: Dg Chest 2 View  Result Date: 12/05/2016 CLINICAL DATA:  56 year old male with a history of syncope EXAM: CHEST  2 VIEW COMPARISON:  10/09/2014 FINDINGS: Cardiomediastinal silhouette within normal limits. No evidence of central vascular congestion. No pneumothorax or pleural effusion.  No confluent airspace disease. No displaced fracture. IMPRESSION: No radiographic evidence of acute cardiopulmonary disease Electronically Signed   By: Gilmer Mor D.O.   On: 12/05/2016 13:54     Medications:  Scheduled: . aspirin EC  81 mg Oral Daily  . carvedilol  6.25 mg Oral BID WC  . heparin  5,000 Units Subcutaneous Q8H  . lisinopril  10 mg Oral Daily  . sodium chloride flush  3 mL Intravenous Q12H   Continuous:  [id number] **OR** acetaminophen, alum & mag hydroxide-simeth, nitroGLYCERIN, ondansetron **OR** ondansetron (ZOFRAN) IV, polyethylene glycol, traZODone  Assessment/Plan:  Principal Problem:   Syncope and collapse Active Problems:   Essential hypertension, benign   LBBB (left bundle branch block)   Nonischemic cardiomyopathy (HCC)   Chronic systolic heart failure (HCC)   Anxiety    Syncope In the setting of nonischemic cardiomyopathy. Patient's EKG shows LBBB which is old for him. Previously seen by electrophysiologist in 2016. At that time was not thought to require ICD. Patient hasn't had any arrhythmias on telemetry. Echocardiogram done this morning. Ejection fraction is 20-20%. Cardiology to address further. And was offered ICD placement. However, he would like to discuss this further with his primary cardiologist. So a LifeVest was recommended. This is being pursued at this time. Patient remains stable. Chest x-ray did not show any acute abnormalities.   Nonischemic cardiomyopathy. Ejection fraction is 20-25%. Appears to be well compensated at this time. Continue home  medications including beta blockers and ACE inhibitor.  History of essential hypertension. Stable. Continue home medications.  Mildly elevated creatinine. Normal this morning. Mildly abnormal UA with leukocytes and nitrite. However, patient denies any symptoms suggestive of UTI. No need for treatment.  DVT Prophylaxis: Subcutaneous heparin    Code Status: Full code  Family Communication: Discussed with the patient and his wife  Disposition Plan: Management as outlined above. Await LifeVest placement.    LOS: 0 days   [facility]  [facility] Pager [phone] 12/06/2016, 12:13 PM  If 7PM-7AM, please contact night-coverage at [email or web address], password [facility]

## 2016-12-07 DIAGNOSIS — I110 Hypertensive heart disease with heart failure: Secondary | ICD-10-CM | POA: Diagnosis present

## 2016-12-07 DIAGNOSIS — D649 Anemia, unspecified: Secondary | ICD-10-CM | POA: Diagnosis present

## 2016-12-07 DIAGNOSIS — R3121 Asymptomatic microscopic hematuria: Secondary | ICD-10-CM | POA: Diagnosis not present

## 2016-12-07 DIAGNOSIS — Z79899 Other long term (current) drug therapy: Secondary | ICD-10-CM | POA: Diagnosis not present

## 2016-12-07 DIAGNOSIS — I10 Essential (primary) hypertension: Secondary | ICD-10-CM | POA: Diagnosis not present

## 2016-12-07 DIAGNOSIS — Z7982 Long term (current) use of aspirin: Secondary | ICD-10-CM | POA: Diagnosis not present

## 2016-12-07 DIAGNOSIS — I5022 Chronic systolic (congestive) heart failure: Secondary | ICD-10-CM | POA: Diagnosis not present

## 2016-12-07 DIAGNOSIS — I428 Other cardiomyopathies: Secondary | ICD-10-CM | POA: Diagnosis present

## 2016-12-07 DIAGNOSIS — F419 Anxiety disorder, unspecified: Secondary | ICD-10-CM | POA: Diagnosis present

## 2016-12-07 DIAGNOSIS — K219 Gastro-esophageal reflux disease without esophagitis: Secondary | ICD-10-CM | POA: Diagnosis present

## 2016-12-07 DIAGNOSIS — Z959 Presence of cardiac and vascular implant and graft, unspecified: Secondary | ICD-10-CM | POA: Diagnosis not present

## 2016-12-07 DIAGNOSIS — R55 Syncope and collapse: Secondary | ICD-10-CM | POA: Diagnosis present

## 2016-12-07 DIAGNOSIS — I429 Cardiomyopathy, unspecified: Secondary | ICD-10-CM | POA: Diagnosis not present

## 2016-12-07 DIAGNOSIS — I447 Left bundle-branch block, unspecified: Secondary | ICD-10-CM | POA: Diagnosis present

## 2016-12-07 DIAGNOSIS — G4700 Insomnia, unspecified: Secondary | ICD-10-CM | POA: Diagnosis present

## 2016-12-07 DIAGNOSIS — I5042 Chronic combined systolic (congestive) and diastolic (congestive) heart failure: Secondary | ICD-10-CM | POA: Diagnosis present

## 2016-12-07 NOTE — Progress Notes (Signed)
   I spoke with Jeremy Wagner by phone this morning and strongly recommended proceeding with ICD resynchronization device. It will not be practical to wear a Life Vest indefinitely. He is still not certain he will proceed with AICD. He does need instruction concerning whether he will be able to work if [employer] is in place.

## 2016-12-07 NOTE — Progress Notes (Signed)
[facility] PROGRESS NOTE  Jeremy Wagner HFW:[id number] DOB: 12-16-1960 DOA: 12/04/2016  PCP: Georgianne Fick, MD  Brief History/Interval Summary: 56 year old African-American male with a past medical history of nonischemic cardiomyopathy with an ejection fraction of 30-35% based on echocardiogram done in 2016. Apparently had a cardiac catheterization in 2009, which revealed normal coronary arteries. He also underwent stress test in 2016, which did not reveal any ischemia. He presented after sudden episode of syncope while he was at work. Due to his cardiac history patient was hospitalized for further evaluation.  Reason for Visit: Syncope  Consultants: Cardiology.  Procedures:  Transthoracic echocardiogram Study Conclusions  - Left ventricle: The cavity size was normal. Wall thickness was   increased in a pattern of mild LVH. Systolic function was   severely reduced. The estimated ejection fraction was in the   range of 20% to 25%. Diffuse hypokinesis. Doppler parameters are   consistent with abnormal left ventricular relaxation (grade 1   diastolic dysfunction). - Regional wall motion abnormality: Akinesis of the mid anterior   and basal-mid anteroseptal myocardium. - Aortic valve: Mildly calcified annulus. Trileaflet; normal   thickness leaflets. Valve area (VTI): 1.69 cm^2. Valve area   (Vmax): 1.93 cm^2. Valve area (Vmean): 1.83 cm^2. - Left atrium: The atrium was mildly dilated. - Atrial septum: No defect or patent foramen ovale was identified. - Technically adequate study.  Antibiotics: None  Subjective/Interval History: Patient feels well. Denies any complaints.   ROS: Denies any headaches.  Objective:  Vital Signs  Vitals:   12/06/16 1952 12/07/16 0424 12/07/16 0830 12/07/16 1211  BP: 135/82 127/80 122/78 113/71  Pulse: 65 90 68 73  Resp: 18 18  16   Temp: 99.1 F (37.3 C) 97.9 F (36.6 C)  98.3 F (36.8 C)  TempSrc: Oral Oral  Oral  SpO2:  100% 99%  97%  Weight:  73.5 kg (162 lb)    Height:        Intake/Output Summary (Last 24 hours) at 12/07/16 1412 Last data filed at 12/07/16 1212  Gross per 24 hour  Intake             1320 ml  Output                0 ml  Net             1320 ml   Filed Weights   12/05/16 0602 12/06/16 0438 12/07/16 0424  Weight: 73.5 kg (162 lb 1.6 oz) 73.3 kg (161 lb 8 oz) 73.5 kg (162 lb)    General appearance: alert, cooperative, appears stated age and no distress Resp: clear to auscultation bilaterally Cardio: regular rate and rhythm, S1, S2 normal, no murmur, click, rub or gallop GI: soft, non-tender; bowel sounds normal; no masses,  no organomegaly   Lab Results:  Data Reviewed: I have personally reviewed following labs and imaging studies  CBC:  Recent Labs Lab 12/04/16 1250 12/04/16 1424 12/05/16 0117 12/06/16 0155  WBC 7.5 7.2 6.5 6.3  HGB 11.8* 11.8* 11.3* 12.1*  HCT 35.3* 35.5* 34.6* 36.6*  MCV 93.1 93.4 94.3 93.6  PLT 170 172 164 178    Basic Metabolic Panel:  Recent Labs Lab 12/04/16 1250 12/04/16 1424 12/05/16 0117 12/06/16 0155  NA 139  --  138 138  K 4.0  --  3.8 4.0  CL 106  --  103 103  CO2 26  --  26 27  GLUCOSE 100*  --  107* 96  BUN 9  --  13 12  CREATININE 1.12 1.10 1.33* 1.21  CALCIUM 9.1  --  8.8* 9.1    GFR: Estimated Creatinine Clearance: 70.9 mL/min (by C-G formula based on SCr of 1.21 mg/dL).  Liver Function Tests:  Recent Labs Lab 12/05/16 0117  AST 20  ALT 18  ALKPHOS 60  BILITOT 0.6  PROT 6.3*  ALBUMIN 3.1*    Cardiac Enzymes:  Recent Labs Lab 12/04/16 1424 12/04/16 1956 12/05/16 0117  TROPONINI <0.03 <0.03 <0.03    CBG:  Recent Labs Lab 12/04/16 1219 12/05/16 0606 12/06/16 0600  GLUCAP 116* 89 102*    Thyroid Function Tests:  Recent Labs  12/04/16 1424  TSH 1.151     Radiology Studies: No results found.   Medications:  Scheduled: . aspirin EC  81 mg Oral Daily  . carvedilol  6.25 mg Oral  BID WC  . heparin  5,000 Units Subcutaneous Q8H  . lisinopril  10 mg Oral Daily  . sodium chloride flush  3 mL Intravenous Q12H   Continuous:  [id number] **OR** acetaminophen, alum & mag hydroxide-simeth, nitroGLYCERIN, ondansetron **OR** ondansetron (ZOFRAN) IV, polyethylene glycol, traZODone  Assessment/Plan:  Principal Problem:   Syncope and collapse Active Problems:   Essential hypertension, benign   LBBB (left bundle branch block)   Nonischemic cardiomyopathy (HCC)   Chronic systolic heart failure (HCC)   Anxiety    Syncope In the setting of nonischemic cardiomyopathy. Patient's EKG shows LBBB which is old for him. Previously seen by electrophysiologist in 2016. At that time was not thought to require ICD. Patient hasn't had any arrhythmias on telemetry. Echocardiogram done this morning. Ejection fraction is 20-20%. Cardiology was consulted. Patient was offered ICD placement. However, he would like to discuss this further with his primary cardiologist. So a LifeVest was recommended. This is being pursued at this time. Patient remains stable. Chest x-ray did not show any acute abnormalities.   Nonischemic cardiomyopathy. Ejection fraction is 20-25%. Appears to be well compensated at this time. Continue home medications including beta blockers and ACE inhibitor.  History of essential hypertension. Stable. Continue home medications.  Mildly elevated creatinine. Resolved. Mildly abnormal UA with leukocytes and nitrite. However, patient denies any symptoms suggestive of UTI. No need for treatment.  Normocytic anemia. Outpatient management by PCP. No overt bleeding noted.  DVT Prophylaxis: Subcutaneous heparin    Code Status: Full code  Family Communication: Discussed with the patient and his wife  Disposition Plan: Management as outlined above. Await LifeVest placement.    LOS: 0 days   [facility]  [facility] Pager [phone] 12/07/2016, 2:12 PM  If  7PM-7AM, please contact night-coverage at [email or web address], password [facility]

## 2016-12-07 NOTE — Progress Notes (Signed)
Progress Note  Patient Name: Jeremy Wagner Date of Encounter: 12/07/2016  Primary Cardiologist: [name]  Subjective   Feeling well, no CP, SOB, Syncope  Inpatient Medications    Scheduled Meds: . aspirin EC  81 mg Oral Daily  . carvedilol  6.25 mg Oral BID WC  . heparin  5,000 Units Subcutaneous Q8H  . lisinopril  10 mg Oral Daily  . sodium chloride flush  3 mL Intravenous Q12H   Continuous Infusions:  PRN Meds: acetaminophen **OR** acetaminophen, alum & mag hydroxide-simeth, nitroGLYCERIN, ondansetron **OR** ondansetron (ZOFRAN) IV, polyethylene glycol, traZODone   Vital Signs    Vitals:   12/06/16 1340 12/06/16 1952 12/07/16 0424 12/07/16 0830  BP: 123/73 135/82 127/80 122/78  Pulse: 65 65 90 68  Resp: 20 18 18    Temp: 98.1 F (36.7 C) 99.1 F (37.3 C) 97.9 F (36.6 C)   TempSrc: Oral Oral Oral   SpO2: 99% 100% 99%   Weight:   162 lb (73.5 kg)   Height:        Intake/Output Summary (Last 24 hours) at 12/07/16 0846 Last data filed at 12/06/16 1952  Gross per 24 hour  Intake             1080 ml  Output                0 ml  Net             1080 ml   Filed Weights   12/05/16 0602 12/06/16 0438 12/07/16 0424  Weight: 162 lb 1.6 oz (73.5 kg) 161 lb 8 oz (73.3 kg) 162 lb (73.5 kg)    Telemetry    Sinus rhythm - Personally Reviewed  ECG    Sinus rhythm, LBBB - Personally Reviewed  Physical Exam   GEN: No acute distress.   Neck: No JVD Cardiac: RRR, no murmurs, rubs, or gallops.  Respiratory: Clear to auscultation bilaterally. GI: Soft, nontender, non-distended  MS: No edema; No deformity. Neuro:  Nonfocal  Psych: Normal affect   Labs    Chemistry  Recent Labs Lab 12/04/16 1250 12/04/16 1424 12/05/16 0117 12/06/16 0155  NA 139  --  138 138  K 4.0  --  3.8 4.0  CL 106  --  103 103  CO2 26  --  26 27  GLUCOSE 100*  --  107* 96  BUN 9  --  13 12  CREATININE 1.12 1.10 1.33* 1.21  CALCIUM 9.1  --  8.8* 9.1  PROT  --   --  6.3*  --     ALBUMIN  --   --  3.1*  --   AST  --   --  20  --   ALT  --   --  18  --   ALKPHOS  --   --  60  --   BILITOT  --   --  0.6  --   GFRNONAA >60 >60 58* >60  GFRAA >60 >60 >60 >60  ANIONGAP 7  --  9 8     Hematology  Recent Labs Lab 12/04/16 1424 12/05/16 0117 12/06/16 0155  WBC 7.2 6.5 6.3  RBC 3.80* 3.67* 3.91*  HGB 11.8* 11.3* 12.1*  HCT 35.5* 34.6* 36.6*  MCV 93.4 94.3 93.6  MCH 31.1 30.8 30.9  MCHC 33.2 32.7 33.1  RDW 12.5 12.8 12.6  PLT 172 164 178    Cardiac Enzymes  Recent Labs Lab 12/04/16 1424 12/04/16 1956 12/05/16 0117  TROPONINI <0.03 <0.03 <0.03   No results for input(s): TROPIPOC in the last 168 hours.   BNPNo results for input(s): BNP, PROBNP in the last 168 hours.   DDimer No results for input(s): DDIMER in the last 168 hours.   Radiology    Dg Chest 2 View  Result Date: 12/05/2016 CLINICAL DATA:  56 year old male with a history of syncope EXAM: CHEST  2 VIEW COMPARISON:  10/09/2014 FINDINGS: Cardiomediastinal silhouette within normal limits. No evidence of central vascular congestion. No pneumothorax or pleural effusion.  No confluent airspace disease. No displaced fracture. IMPRESSION: No radiographic evidence of acute cardiopulmonary disease Electronically Signed   By: Gilmer Mor D.O.   On: 12/05/2016 13:54    Cardiac Studies   2016 TTE - Left ventricle: LV strain is abnormal at -11.4% The cavity size was mildly dilated. Systolic function was moderately to severely reduced. The estimated ejection fraction was in the range of 30% to 35%. There is akinesis of the entireinferoseptal myocardium. There is akinesis of the entireinferior myocardium. There is akinesis of the apicalanterior myocardium. There is akinesis of the midanterolateral myocardium. There was an increased relative contribution of atrial contraction to ventricular filling. Doppler parameters are consistent with abnormal left ventricular relaxation  (grade 1 diastolic dysfunction). - Ventricular septum: Septal motion showed paradox. These changes are consistent with a bundle branch block. - Aortic valve: Mildly calcified leaflets. There was trivial regurgitation. - Pulmonic valve: There was mild regurgitation.  Patient Profile     56 y.o. male  with history of NICM, HTN, LBBB, reflux, anxiety whom we are asked to see for syncope. He has longstanding history of NICM back to 2009 when cath showed EF 35-40%, normal coronary arteries. Presented to the hospital with an episode of syncope.  Assessment & Plan    1. Syncope - No further episodes of syncope since being in the hospital. I am worried that this episode could be related to VT in the setting of a nonischemic cardiomyopathy. Have recommended an ICD for secondary prevention.  The patient would like to discuss this with his primary cardiologist and this am working on a life vest for secondary prevention of  VT/VF and sudden death.  2. NICM/chronic systolic CHF - appears euvolemic. Has had minimal issues related to this over the years. Continue ACEI and BB. Updated echo pending.  3. HTN - well controlled today  4. LBBB - chronic for patient.   5. Incidental mild anemia, abnormal UA - per internal medicine.  Signed, Mirian Casco Jorja Loa, MD  12/07/2016, 8:46 AM

## 2016-12-07 NOTE — Care Management Note (Signed)
Case Management Note Donn Pierini RN, BSN Unit 2W-Case Manager [id number]  Patient Details  Name: Jeremy Wagner MRN: [id number] Date of Birth: 01-09-1961  Subjective/Objective:   Pt admitted with syncope-                  Action/Plan: PTA pt lived at home- referral received for Life Vest needs- noted on 3/26 that order has already been faxed on 3/25 to [employer]- received call from Eckhart Mines with [employer] that pt is pending insurance review/approval with [employer] (Emily's contact # [phone]) per Normajean Glasgow can take up to 72 hr. To approve- CM did make call to [facility] at [phone]) and spoke with Chrissie Noa regarding pt's Life Vest - per Chrissie Noa at [place]- request is in review for LifeVest approval- and can take up to 3 business days starting today- CM did request that a note be made on review that pt is pending discharge based on the review for Life Vest approval. -  MD made aware of situation.   Expected Discharge Date:                  Expected Discharge Plan:  Home/Self Care  In-House Referral:     Discharge planning Services  CM Consult  Post Acute Care Choice:  Durable Medical Equipment Choice offered to:  Patient  DME Arranged:  Life vest DME Agency:  [employer]  HH Arranged:    HH Agency:     Status of Service:  In process, will continue to follow  If discussed at Long Length of Stay Meetings, dates discussed:    Additional Comments:  Albaraa, [place], RN 12/07/2016, 2:27 PM

## 2016-12-08 LAB — GLUCOSE, CAPILLARY: Glucose-Capillary: 107 mg/dL — ABNORMAL HIGH (ref 65–99)

## 2016-12-08 NOTE — Progress Notes (Signed)
Patient still awaiting life vest fitting. Awaiting insurance approval. Call if further EP issues arise.  Jeremy Brooklyn, MD

## 2016-12-08 NOTE — Progress Notes (Signed)
[facility] PROGRESS NOTE  Jeremy Wagner ZOX:[id number] DOB: 04/20/1961 DOA: 12/04/2016  PCP: Georgianne Fick, MD  Brief History/Interval Summary: 56 year old African-American male with a past medical history of nonischemic cardiomyopathy with an ejection fraction of 30-35% based on echocardiogram done in 2016. Apparently had a cardiac catheterization in 2009, which revealed normal coronary arteries. He also underwent stress test in 2016, which did not reveal any ischemia. He presented after sudden episode of syncope while he was at work. Due to his cardiac history patient was hospitalized for further evaluation. He was seen by cardiology. ICD was recommended. However, patient wants to think about it. He is thought to be at high risk for sudden cardiac event so life vest was recommended. Awaiting insurance approval for same.  Reason for Visit: Syncope  Consultants: Cardiology.  Procedures:  Transthoracic echocardiogram Study Conclusions  - Left ventricle: The cavity size was normal. Wall thickness was   increased in a pattern of mild LVH. Systolic function was   severely reduced. The estimated ejection fraction was in the   range of 20% to 25%. Diffuse hypokinesis. Doppler parameters are   consistent with abnormal left ventricular relaxation (grade 1   diastolic dysfunction). - Regional wall motion abnormality: Akinesis of the mid anterior   and basal-mid anteroseptal myocardium. - Aortic valve: Mildly calcified annulus. Trileaflet; normal   thickness leaflets. Valve area (VTI): 1.69 cm^2. Valve area   (Vmax): 1.93 cm^2. Valve area (Vmean): 1.83 cm^2. - Left atrium: The atrium was mildly dilated. - Atrial septum: No defect or patent foramen ovale was identified. - Technically adequate study.  Antibiotics: None  Subjective/Interval History: No complaints offered.  ROS: Denies any headaches.  Objective:  Vital Signs  Vitals:   12/07/16 1211 12/07/16 1722  12/07/16 2011 12/08/16 0605  BP: 113/71 126/71 133/78 119/77  Pulse: 73 60 (!) 55 63  Resp: 16  16 16   Temp: 98.3 F (36.8 C)  98.4 F (36.9 C) 98.3 F (36.8 C)  TempSrc: Oral  Oral Oral  SpO2: 97%  100% 98%  Weight:    74.4 kg (164 lb)  Height:        Intake/Output Summary (Last 24 hours) at 12/08/16 0952 Last data filed at 12/07/16 1212  Gross per 24 hour  Intake              360 ml  Output                0 ml  Net              360 ml   Filed Weights   12/06/16 0438 12/07/16 0424 12/08/16 0605  Weight: 73.3 kg (161 lb 8 oz) 73.5 kg (162 lb) 74.4 kg (164 lb)    General appearance: alert, cooperative, appears stated age and no distress Resp: clear to auscultation bilaterally Cardio: regular rate and rhythm, S1, S2 normal, no murmur, click, rub or gallop GI: soft, non-tender; bowel sounds normal; no masses,  no organomegaly   Lab Results:  Data Reviewed: I have personally reviewed following labs and imaging studies  CBC:  Recent Labs Lab 12/04/16 1250 12/04/16 1424 12/05/16 0117 12/06/16 0155  WBC 7.5 7.2 6.5 6.3  HGB 11.8* 11.8* 11.3* 12.1*  HCT 35.3* 35.5* 34.6* 36.6*  MCV 93.1 93.4 94.3 93.6  PLT 170 172 164 178    Basic Metabolic Panel:  Recent Labs Lab 12/04/16 1250 12/04/16 1424 12/05/16 0117 12/06/16 0155  NA 139  --  138 138  K 4.0  --  3.8 4.0  CL 106  --  103 103  CO2 26  --  26 27  GLUCOSE 100*  --  107* 96  BUN 9  --  13 12  CREATININE 1.12 1.10 1.33* 1.21  CALCIUM 9.1  --  8.8* 9.1    GFR: Estimated Creatinine Clearance: 71.7 mL/min (by C-G formula based on SCr of 1.21 mg/dL).  Liver Function Tests:  Recent Labs Lab 12/05/16 0117  AST 20  ALT 18  ALKPHOS 60  BILITOT 0.6  PROT 6.3*  ALBUMIN 3.1*    Cardiac Enzymes:  Recent Labs Lab 12/04/16 1424 12/04/16 1956 12/05/16 0117  TROPONINI <0.03 <0.03 <0.03    CBG:  Recent Labs Lab 12/04/16 1219 12/05/16 0606 12/06/16 0600 12/08/16 0603  GLUCAP 116* 89 102*  107*    Radiology Studies: No results found.   Medications:  Scheduled: . aspirin EC  81 mg Oral Daily  . carvedilol  6.25 mg Oral BID WC  . heparin  5,000 Units Subcutaneous Q8H  . lisinopril  10 mg Oral Daily  . sodium chloride flush  3 mL Intravenous Q12H   Continuous:  PPJ:[id number] **OR** acetaminophen, alum & mag hydroxide-simeth, nitroGLYCERIN, ondansetron **OR** ondansetron (ZOFRAN) IV, polyethylene glycol, traZODone  Assessment/Plan:  Principal Problem:   Syncope and collapse Active Problems:   Essential hypertension, benign   LBBB (left bundle branch block)   Nonischemic cardiomyopathy (HCC)   Chronic systolic heart failure (HCC)   Anxiety    Syncope In the setting of nonischemic cardiomyopathy. Patient's EKG shows LBBB which is old for him. Previously seen by electrophysiologist in 2016. At that time was not thought to require ICD. Patient hasn't had any arrhythmias on telemetry. Echocardiogram done this morning. Ejection fraction is 20-20%. Cardiology was consulted. Patient was offered ICD placement. However, he would like to discuss this further with his primary cardiologist. So a LifeVest was recommended. This is being pursued at this time. Patient remains stable. Chest x-ray did not show any acute abnormalities.   Nonischemic cardiomyopathy. Ejection fraction is 20-25%. Appears to be well compensated at this time. Continue home medications including beta blockers and ACE inhibitor.  History of essential hypertension. Stable. Continue home medications.  Mildly elevated creatinine. Resolved. Mildly abnormal UA with leukocytes and nitrite. However, patient denies any symptoms suggestive of UTI. No need for treatment.  Normocytic anemia. Outpatient management by PCP. No overt bleeding noted.  DVT Prophylaxis: Subcutaneous heparin    Code Status: Full code  Family Communication: Discussed with the patient and his wife  Disposition Plan: Management as  outlined above. Await LifeVest placement.    LOS: 1 day   [facility]  [facility] Pager [phone] 12/08/2016, 9:52 AM  If 7PM-7AM, please contact night-coverage at [email or web address], password [facility]

## 2016-12-08 NOTE — Treatment Plan (Signed)
See staff message result below.  Julio Sicks, LPN  Laurann Montana, PA-C        Dr. [name] called the pt. He's scheduled to see Graciela Husbands 4/4 and [name] 4/9.   Victorino Dike   Previous Messages    ----- Message -----  From: Laurann Montana, PA-C  Sent: 12/05/2016 12:36 PM  To: Julio Sicks, LPN  Subject: Needs close f/u with Dr. [name] or for Dr. Dionicia Abler*   Redgie Grayer,  This is a patient of Dr. Michaelle Copas who was admitted this weekend with syncope. He has a low EF and Dr. [name] previously referred him to EP to consider an ICD but he was not felt to need it at that time because he was asymptomatic. Now that he is coming in with syncope, Dr. Elberta Fortis recommends this. The patient would like to speak with Dr. [name] first. He thought he had an appointment coming up this week but I do not see anything on file. Could you help facilitate this, or perhaps see if Dr. [name] will call him to further discuss? We are going to try and get a LifeVest arranged for him in the meantime. Thanks,  Ronie Spies PA-C

## 2016-12-09 ENCOUNTER — Telehealth: Payer: Self-pay | Admitting: Physician Assistant

## 2016-12-09 ENCOUNTER — Inpatient Hospital Stay (HOSPITAL_COMMUNITY)
Admission: EM | Disposition: A | Discharge status: Home / Self Care | Payer: Self-pay | Source: Home / Self Care | Attending: Internal Medicine

## 2016-12-09 DIAGNOSIS — I429 Cardiomyopathy, unspecified: Secondary | ICD-10-CM

## 2016-12-09 DIAGNOSIS — F419 Anxiety disorder, unspecified: Secondary | ICD-10-CM

## 2016-12-09 DIAGNOSIS — R3121 Asymptomatic microscopic hematuria: Secondary | ICD-10-CM

## 2016-12-09 HISTORY — PX: BIV ICD INSERTION CRT-D: EP1195

## 2016-12-09 LAB — BASIC METABOLIC PANEL
Anion gap: 10 (ref 5–15)
BUN: 11 mg/dL (ref 6–20)
CO2: 28 mmol/L (ref 22–32)
Calcium: 9.1 mg/dL (ref 8.9–10.3)
Chloride: 101 mmol/L (ref 101–111)
Creatinine, Ser: 1.11 mg/dL (ref 0.61–1.24)
GFR calc Af Amer: >60 mL/min (ref >60)
GFR calc non Af Amer: >60 mL/min (ref >60)
Glucose, Bld: 94 mg/dL (ref 65–99)
Potassium: 3.9 mmol/L (ref 3.5–5.1)
Sodium: 139 mmol/L (ref 135–145)

## 2016-12-09 LAB — GLUCOSE, CAPILLARY: Glucose-Capillary: 101 mg/dL — ABNORMAL HIGH (ref 65–99)

## 2016-12-09 LAB — MRSA PCR SCREENING: MRSA by PCR: NEGATIVE

## 2016-12-09 SURGERY — BIV ICD INSERTION CRT-D

## 2016-12-09 MED ORDER — FENTANYL CITRATE (PF) 100 MCG/2ML IJ SOLN
INTRAMUSCULAR | Status: AC
  Filled 2016-12-09: qty 2

## 2016-12-09 MED ORDER — CHLORHEXIDINE GLUCONATE 4 % EX LIQD: 60.0000 mL | Freq: Once | CUTANEOUS | Status: DC

## 2016-12-09 MED ORDER — MIDAZOLAM HCL 5 MG/5ML IJ SOLN
INTRAMUSCULAR | Status: AC
  Filled 2016-12-09: qty 5

## 2016-12-09 MED ORDER — HEPARIN (PORCINE) IN NACL 2-0.9 UNIT/ML-% IJ SOLN
INTRAMUSCULAR | Status: DC | PRN
  Administered 2016-12-09: 15:00:00

## 2016-12-09 MED ORDER — SODIUM CHLORIDE 0.9 % IV SOLN
INTRAVENOUS | Status: DC
Start: 2016-12-09 — End: 2016-12-09

## 2016-12-09 MED ORDER — LIDOCAINE HCL (PF) 1 % IJ SOLN
INTRAMUSCULAR | Status: DC | PRN
  Administered 2016-12-09: 45 mL

## 2016-12-09 MED ORDER — SODIUM CHLORIDE 0.9 % IR SOLN
Status: AC
  Filled 2016-12-09: qty 2

## 2016-12-09 MED ORDER — SODIUM CHLORIDE 0.9 % IR SOLN
80.0000 mg | Status: AC
  Administered 2016-12-09: 80 mg
  Filled 2016-12-09: qty 2

## 2016-12-09 MED ORDER — MIDAZOLAM HCL 5 MG/5ML IJ SOLN
INTRAMUSCULAR | Status: DC | PRN
  Administered 2016-12-09 (×3): 1 mg via INTRAVENOUS
  Administered 2016-12-09: 2 mg via INTRAVENOUS

## 2016-12-09 MED ORDER — IOPAMIDOL (ISOVUE-370) INJECTION 76%
INTRAVENOUS | Status: AC
  Filled 2016-12-09: qty 50

## 2016-12-09 MED ORDER — ONDANSETRON HCL 4 MG/2ML IJ SOLN
4.0000 mg | Freq: Four times a day (QID) | INTRAMUSCULAR | Status: DC | PRN
  Administered 2016-12-10: 4 mg via INTRAVENOUS
  Filled 2016-12-09: qty 2

## 2016-12-09 MED ORDER — HYDROCODONE-ACETAMINOPHEN 5-325 MG PO TABS
1.0000 | ORAL_TABLET | ORAL | Status: DC | PRN
  Administered 2016-12-09 – 2016-12-10 (×2): 2 via ORAL
  Administered 2016-12-10: 1 via ORAL
  Filled 2016-12-09: qty 2
  Filled 2016-12-09: qty 1
  Filled 2016-12-09: qty 2

## 2016-12-09 MED ORDER — ACETAMINOPHEN 325 MG PO TABS
325.0000 mg | ORAL_TABLET | ORAL | Status: DC | PRN
  Administered 2016-12-09: 650 mg via ORAL
  Filled 2016-12-09: qty 2

## 2016-12-09 MED ORDER — SODIUM CHLORIDE 0.9 % IV SOLN
INTRAVENOUS | Status: DC
  Administered 2016-12-09: 14:00:00 via INTRAVENOUS

## 2016-12-09 MED ORDER — CEFAZOLIN IN D5W 1 GM/50ML IV SOLN
1.0000 g | Freq: Four times a day (QID) | INTRAVENOUS | Status: AC
  Administered 2016-12-09 – 2016-12-10 (×3): 1 g via INTRAVENOUS
  Filled 2016-12-09 (×3): qty 50

## 2016-12-09 MED ORDER — FENTANYL CITRATE (PF) 100 MCG/2ML IJ SOLN
INTRAMUSCULAR | Status: DC | PRN
  Administered 2016-12-09 (×2): 25 ug via INTRAVENOUS
  Administered 2016-12-09: 50 ug via INTRAVENOUS

## 2016-12-09 MED ORDER — LIDOCAINE HCL (PF) 1 % IJ SOLN
INTRAMUSCULAR | Status: AC
  Filled 2016-12-09: qty 60

## 2016-12-09 MED ORDER — CEFAZOLIN SODIUM-DEXTROSE 2-4 GM/100ML-% IV SOLN
INTRAVENOUS | Status: AC
  Filled 2016-12-09: qty 100

## 2016-12-09 MED ORDER — CEFAZOLIN SODIUM-DEXTROSE 2-4 GM/100ML-% IV SOLN
2.0000 g | INTRAVENOUS | Status: AC
  Administered 2016-12-09: 2 g via INTRAVENOUS
  Filled 2016-12-09: qty 100

## 2016-12-09 MED ORDER — SODIUM CHLORIDE 0.9 % IV SOLN: INTRAVENOUS | Status: AC

## 2016-12-09 SURGICAL SUPPLY — 15 items
CABLE SURGICAL S-101-97-12 (CABLE) ×2 IMPLANT
CATH CPS DIRECT 135 DS2C020 (CATHETERS) ×2 IMPLANT
ICD CLARIA MRI DTMA1QQ (ICD Generator) ×2 IMPLANT
KIT ESSENTIALS PG (KITS) ×2 IMPLANT
LEAD CAPSURE NOVUS 5076-52CM (Lead) ×2 IMPLANT
LEAD QUARTET 1458Q-86CM (Lead) ×2 IMPLANT
LEAD SPRINT QUAT SEC 6935M-62 (Lead) ×2 IMPLANT
PAD DEFIB LIFELINK (PAD) ×2 IMPLANT
SHEATH CLASSIC 7F (SHEATH) ×2 IMPLANT
SHEATH CLASSIC 9.5F (SHEATH) ×2 IMPLANT
SHEATH CLASSIC 9F (SHEATH) ×2 IMPLANT
SLITTER UNIVERSAL DS2A003 (MISCELLANEOUS) ×2 IMPLANT
TRAY PACEMAKER INSERTION (PACKS) ×2 IMPLANT
WIRE ACUITY WHISPER EDS 4648 (WIRE) ×2 IMPLANT
WIRE HI TORQ VERSACORE-J 145CM (WIRE) ×2 IMPLANT

## 2016-12-09 NOTE — Telephone Encounter (Signed)
I received call from [facility] this evening in clinic requesting further clarification for LifeVest. I see that he received BiV-ICD today. I informed them of this, that Lifevest no longer needed. [employer] verbalized understanding. Fortune Torosian PA-C

## 2016-12-09 NOTE — Progress Notes (Signed)
Pt c/o of pain at incison site 8/10. Pt requesting pain meds stronger than tylenol. MD notified. Will continue to monitor. Gregor Hams, RN

## 2016-12-09 NOTE — Interval H&P Note (Signed)
ICD Criteria  Current LVEF:20%. Within 12 months prior to implant: Yes   Heart failure history: Yes, Class I  Cardiomyopathy history: Yes, Non-Ischemic Cardiomyopathy.  Atrial Fibrillation/Atrial Flutter: No.  Ventricular tachycardia history: No.  Cardiac arrest history: No.  History of syndromes with risk of sudden death: No.   Previous ICD: No.  Current ICD indication: Secondary  PPM indication: No.   Class I or II Bradycardia indication present: No  Beta Blocker therapy for 3 or more months: Yes, prescribed.   Ace Inhibitor/ARB therapy for 3 or more months: Yes, prescribed.   History and Physical Interval Note:  12/09/2016 12:56 PM  Jeremy Wagner  has presented today for surgery, with the diagnosis of syncope  The various methods of treatment have been discussed with the patient and family. After consideration of risks, benefits and other options for treatment, the patient has consented to  Procedure(s): BiV ICD Insertion CRT-D (N/A) as a surgical intervention .  The patient's history has been reviewed, patient examined, no change in status, stable for surgery.  I have reviewed the patient's chart and labs.  Questions were answered to the patient's satisfaction.     Sherryl Manges

## 2016-12-09 NOTE — Progress Notes (Signed)
[facility] PROGRESS NOTE  Jeremy Wagner ZOX:[id number] DOB: 11-14-1960 DOA: 12/04/2016  PCP: Georgianne Fick, MD  Brief History/Interval Summary: 56 year old African-American male with a past medical history of nonischemic cardiomyopathy with an ejection fraction of 30-35% based on echocardiogram done in 2016. Apparently had a cardiac catheterization in 2009, which revealed normal coronary arteries. He also underwent stress test in 2016, which did not reveal any ischemia. He presented after sudden episode of syncope while he was at work. Due to his cardiac history patient was hospitalized for further evaluation. He was seen by cardiology. ICD was recommended. However, patient wants to think about it. He is thought to be at high risk for sudden cardiac event so life vest was recommended. Awaiting insurance approval for same.  Reason for Visit: Syncope  Consultants: Cardiology.  Procedures:  Transthoracic echocardiogram Study Conclusions  - Left ventricle: The cavity size was normal. Wall thickness was   increased in a pattern of mild LVH. Systolic function was   severely reduced. The estimated ejection fraction was in the   range of 20% to 25%. Diffuse hypokinesis. Doppler parameters are   consistent with abnormal left ventricular relaxation (grade 1   diastolic dysfunction). - Regional wall motion abnormality: Akinesis of the mid anterior   and basal-mid anteroseptal myocardium. - Aortic valve: Mildly calcified annulus. Trileaflet; normal   thickness leaflets. Valve area (VTI): 1.69 cm^2. Valve area   (Vmax): 1.93 cm^2. Valve area (Vmean): 1.83 cm^2. - Left atrium: The atrium was mildly dilated. - Atrial septum: No defect or patent foramen ovale was identified. - Technically adequate study.  Antibiotics: None  Subjective/Interval History: No complaints this morning  ROS: Denies any headaches.  Objective:  Vital Signs  Vitals:   12/08/16 1348 12/08/16 2202  12/09/16 0419 12/09/16 0833  BP: (!) 109/54 124/75 114/71 120/77  Pulse: 69 64 64 72  Resp: 20 18 18    Temp: 98.1 F (36.7 C) 98.6 F (37 C) 97.9 F (36.6 C)   TempSrc: Oral Oral Oral   SpO2: 98% 99% 98%   Weight:   73.4 kg (161 lb 12.8 oz)   Height:        Intake/Output Summary (Last 24 hours) at 12/09/16 0950 Last data filed at 12/08/16 2203  Gross per 24 hour  Intake              840 ml  Output                0 ml  Net              840 ml   Filed Weights   12/07/16 0424 12/08/16 0605 12/09/16 0419  Weight: 73.5 kg (162 lb) 74.4 kg (164 lb) 73.4 kg (161 lb 12.8 oz)    General appearance: alert, cooperative, appears stated age and no distress Resp: clear to auscultation bilaterally Cardio: regular rate and rhythm, S1, S2 normal, no murmur, click, rub or gallop GI: soft, non-tender; bowel sounds normal; no masses,  no organomegaly   Lab Results:  Data Reviewed: I have personally reviewed following labs and imaging studies  CBC:  Recent Labs Lab 12/04/16 1250 12/04/16 1424 12/05/16 0117 12/06/16 0155  WBC 7.5 7.2 6.5 6.3  HGB 11.8* 11.8* 11.3* 12.1*  HCT 35.3* 35.5* 34.6* 36.6*  MCV 93.1 93.4 94.3 93.6  PLT 170 172 164 178    Basic Metabolic Panel:  Recent Labs Lab 12/04/16 1250 12/04/16 1424 12/05/16 0117 12/06/16 0155 12/09/16 0214  NA 139  --  138 138 139  K 4.0  --  3.8 4.0 3.9  CL 106  --  103 103 101  CO2 26  --  26 27 28   GLUCOSE 100*  --  107* 96 94  BUN 9  --  13 12 11   CREATININE 1.12 1.10 1.33* 1.21 1.11  CALCIUM 9.1  --  8.8* 9.1 9.1    GFR: Estimated Creatinine Clearance: 77.1 mL/min (by C-G formula based on SCr of 1.11 mg/dL).  Liver Function Tests:  Recent Labs Lab 12/05/16 0117  AST 20  ALT 18  ALKPHOS 60  BILITOT 0.6  PROT 6.3*  ALBUMIN 3.1*    Cardiac Enzymes:  Recent Labs Lab 12/04/16 1424 12/04/16 1956 12/05/16 0117  TROPONINI <0.03 <0.03 <0.03    CBG:  Recent Labs Lab 12/04/16 1219 12/05/16 0606  12/06/16 0600 12/08/16 0603 12/09/16 0618  GLUCAP 116* 89 102* 107* 101*    Radiology Studies: No results found.   Medications:  Scheduled: . aspirin EC  81 mg Oral Daily  . carvedilol  6.25 mg Oral BID WC  . heparin  5,000 Units Subcutaneous Q8H  . lisinopril  10 mg Oral Daily  . sodium chloride flush  3 mL Intravenous Q12H   Continuous:  DHR:[id number] **OR** acetaminophen, alum & mag hydroxide-simeth, nitroGLYCERIN, ondansetron **OR** ondansetron (ZOFRAN) IV, polyethylene glycol, traZODone  Assessment/Plan:  Principal Problem:   Syncope and collapse Active Problems:   Essential hypertension, benign   LBBB (left bundle branch block)   Nonischemic cardiomyopathy (HCC)   Chronic systolic heart failure (HCC)   Anxiety    Syncope -In the setting of nonischemic cardiomyopathy, LVEF rechecked, still decreased at 20-25%. -Patient's EKG shows LBBB which is old for him. Previously seen by EP in 2016. At that time was not thought to require ICD.  -Patient was offered ICD placement. However, he would like to discuss this further with his primary cardiologist.  -LifeVest recommended for now awaiting insurance approval, EP wishful he will change his mind and have ICD prior to DC. -Likely to be discharged later today if LifeVest approved by his insurance.  Nonischemic cardiomyopathy. Ejection fraction is 20-25%. Appears to be well compensated at this time. Continue home medications including beta blockers and ACE inhibitor.  History of essential hypertension. Stable. Continue home medications.  Mildly elevated creatinine. Resolved. Mildly abnormal UA with leukocytes and nitrite. However, patient denies any symptoms suggestive of UTI. No need for treatment.  Normocytic anemia. Outpatient management by PCP. No overt bleeding noted.  DVT Prophylaxis: Subcutaneous heparin    Code Status: Full code  Family Communication:  Disposition Plan: Management as outlined above.  Await LifeVest placement.    LOS: 2 days   [facility]  [facility] Pager [phone] 12/09/2016, 9:50 AM  If 7PM-7AM, please contact night-coverage at [email or web address], password [facility]

## 2016-12-09 NOTE — H&P (View-Only) (Signed)
SUBJECTIVE: The patient is doing well today.  At this time, he denies chest pain, shortness of breath, or any new concerns.  Marland Kitchen aspirin EC  81 mg Oral Daily  . carvedilol  6.25 mg Oral BID WC  . heparin  5,000 Units Subcutaneous Q8H  . lisinopril  10 mg Oral Daily  . sodium chloride flush  3 mL Intravenous Q12H     OBJECTIVE: Physical Exam: Vitals:   12/08/16 1348 12/08/16 2202 12/09/16 0419 12/09/16 0833  BP: (!) 109/54 124/75 114/71 120/77  Pulse: 69 64 64 72  Resp: 20 18 18    Temp: 98.1 F (36.7 C) 98.6 F (37 C) 97.9 F (36.6 C)   TempSrc: Oral Oral Oral   SpO2: 98% 99% 98%   Weight:   161 lb 12.8 oz (73.4 kg)   Height:        Intake/Output Summary (Last 24 hours) at 12/09/16 0910 Last data filed at 12/08/16 2203  Gross per 24 hour  Intake              840 ml  Output                0 ml  Net              840 ml    Telemetry is reviewed by myself: SB/SR, 50's-60's   GEN- The patient is well appearing, alert and oriented x 3 today.   Head- normocephalic, atraumatic Eyes-  Sclera clear, conjunctiva pink Ears- hearing intact Oropharynx- clear Neck- supple, no JVP Lungs- CTA b/l, normal work of breathing Heart- RRR, no significant murmurs, no rubs or gallops GI- soft, NT, ND Extremities- no clubbing, cyanosis, or edema Skin- no rash or lesion Psych- euthymic mood, full affect Neuro- no gross deficits appreciated  LABS: Basic Metabolic Panel:  Recent Labs  96/04/54 0214  NA 139  K 3.9  CL 101  CO2 28  GLUCOSE 94  BUN 11  CREATININE 1.11  CALCIUM 9.1   12/06/16: TTE Study Conclusions - Left ventricle: The cavity size was normal. Wall thickness was   increased in a pattern of mild LVH. Systolic function was   severely reduced. The estimated ejection fraction was in the   range of 20% to 25%. Diffuse hypokinesis. Doppler parameters are   consistent with abnormal left ventricular relaxation (grade 1   diastolic dysfunction). - Regional wall  motion abnormality: Akinesis of the mid anterior   and basal-mid anteroseptal myocardium. - Aortic valve: Mildly calcified annulus. Trileaflet; normal   thickness leaflets. Valve area (VTI): 1.69 cm^2. Valve area   (Vmax): 1.93 cm^2. Valve area (Vmean): 1.83 cm^2. - Left atrium: The atrium was mildly dilated. - Atrial septum: No defect or patent foramen ovale was identified. - Technically adequate study.    ASSESSMENT AND PLAN:  1. Syncope      LBBB, EF this admission remains depressed, 20-25%     He has longstanding history of NICM back to 2009 when cath showed EF 35-40%, normal coronary arteries     The patient has wanted to hold off ICD implant, until out patient follow to discuss further with Dr. Graciela Husbands      Has been waiting for insurance approval for life vest, still pending      Dr. [name] has discussed via telephone and recommended implant, though is in house today and will come by and discuss further      Dr. Graciela Husbands is here today and will see the  patient      Pending these visits, perhaps if patient is agreeable plan for ICD   The patient is aware of [place] law, no driving for 6 months      2. NICM/chronic systolic CHF     appears euvolemic.      Has had minimal issues related to this over the years.     Continue ACEI and BB  3. HTN       VSS, no changes  4. LBBB      chronic for patient.   5. Incidental mild anemia, abnormal UA - per internal medicine.  Francis Dowse, PA-C 12/09/2016 9:10 AM  PT with syncope and LBBB and NICM  Risks are for VT and bradyarrhythmia and as such dont think lifevest is appropriated Have reviewed with pt and discussed [facility] and will proceed with CRT-D   Risks reviewed   Pt examined   Pt agreeable

## 2016-12-09 NOTE — Progress Notes (Addendum)
SUBJECTIVE: The patient is doing well today.  At this time, he denies chest pain, shortness of breath, or any new concerns.  Marland Kitchen aspirin EC  81 mg Oral Daily  . carvedilol  6.25 mg Oral BID WC  . heparin  5,000 Units Subcutaneous Q8H  . lisinopril  10 mg Oral Daily  . sodium chloride flush  3 mL Intravenous Q12H     OBJECTIVE: Physical Exam: Vitals:   12/08/16 1348 12/08/16 2202 12/09/16 0419 12/09/16 0833  BP: (!) 109/54 124/75 114/71 120/77  Pulse: 69 64 64 72  Resp: 20 18 18    Temp: 98.1 F (36.7 C) 98.6 F (37 C) 97.9 F (36.6 C)   TempSrc: Oral Oral Oral   SpO2: 98% 99% 98%   Weight:   161 lb 12.8 oz (73.4 kg)   Height:        Intake/Output Summary (Last 24 hours) at 12/09/16 0910 Last data filed at 12/08/16 2203  Gross per 24 hour  Intake              840 ml  Output                0 ml  Net              840 ml    Telemetry is reviewed by myself: SB/SR, 50's-60's   GEN- The patient is well appearing, alert and oriented x 3 today.   Head- normocephalic, atraumatic Eyes-  Sclera clear, conjunctiva pink Ears- hearing intact Oropharynx- clear Neck- supple, no JVP Lungs- CTA b/l, normal work of breathing Heart- RRR, no significant murmurs, no rubs or gallops GI- soft, NT, ND Extremities- no clubbing, cyanosis, or edema Skin- no rash or lesion Psych- euthymic mood, full affect Neuro- no gross deficits appreciated  LABS: Basic Metabolic Panel:  Recent Labs  40/98/11 0214  NA 139  K 3.9  CL 101  CO2 28  GLUCOSE 94  BUN 11  CREATININE 1.11  CALCIUM 9.1   12/06/16: TTE Study Conclusions - Left ventricle: The cavity size was normal. Wall thickness was   increased in a pattern of mild LVH. Systolic function was   severely reduced. The estimated ejection fraction was in the   range of 20% to 25%. Diffuse hypokinesis. Doppler parameters are   consistent with abnormal left ventricular relaxation (grade 1   diastolic dysfunction). - Regional wall  motion abnormality: Akinesis of the mid anterior   and basal-mid anteroseptal myocardium. - Aortic valve: Mildly calcified annulus. Trileaflet; normal   thickness leaflets. Valve area (VTI): 1.69 cm^2. Valve area   (Vmax): 1.93 cm^2. Valve area (Vmean): 1.83 cm^2. - Left atrium: The atrium was mildly dilated. - Atrial septum: No defect or patent foramen ovale was identified. - Technically adequate study.    ASSESSMENT AND PLAN:  1. Syncope      LBBB, EF this admission remains depressed, 20-25%     He has longstanding history of NICM back to 2009 when cath showed EF 35-40%, normal coronary arteries     The patient has wanted to hold off ICD implant, until out patient follow to discuss further with Dr. Graciela Husbands      Has been waiting for insurance approval for life vest, still pending      Dr. [name] has discussed via telephone and recommended implant, though is in house today and will come by and discuss further      Dr. Graciela Husbands is here today and will see the  patient      Pending these visits, perhaps if patient is agreeable plan for ICD   The patient is aware of [place] law, no driving for 6 months      2. NICM/chronic systolic CHF     appears euvolemic.      Has had minimal issues related to this over the years.     Continue ACEI and BB  3. HTN       VSS, no changes  4. LBBB      chronic for patient.   5. Incidental mild anemia, abnormal UA - per internal medicine.  Francis Dowse, PA-C 12/09/2016 9:10 AM  PT with syncope and LBBB and NICM  Risks are for VT and bradyarrhythmia and as such dont think lifevest is appropriated Have reviewed with pt and [facility] and will proceed with CRT-D   Risks reviewed   With class 1 symptoms will try for LV lead, but apart from MADIT CRT which would not have included this pt 2/2 symptom class, no CRT indication-- there is some possibility that his cardiomyopathy is LBBB related   Pt examined   Pt agreeable

## 2016-12-10 ENCOUNTER — Encounter (HOSPITAL_COMMUNITY): Payer: Self-pay | Admitting: Internal Medicine

## 2016-12-10 ENCOUNTER — Inpatient Hospital Stay (HOSPITAL_COMMUNITY): Payer: BC Managed Care – PPO

## 2016-12-10 DIAGNOSIS — Z959 Presence of cardiac and vascular implant and graft, unspecified: Secondary | ICD-10-CM

## 2016-12-10 MED ORDER — HYDROCODONE-ACETAMINOPHEN 5-325 MG PO TABS: 1.0000 | ORAL_TABLET | ORAL | 0 refills | Status: DC | PRN

## 2016-12-10 MED ORDER — ASPIRIN 81 MG PO CHEW
81.0000 mg | CHEWABLE_TABLET | Freq: Once | ORAL | Status: AC
  Administered 2016-12-10: 81 mg via ORAL
  Filled 2016-12-10: qty 1

## 2016-12-10 MED ORDER — CARVEDILOL 6.25 MG PO TABS
6.2500 mg | ORAL_TABLET | Freq: Two times a day (BID) | ORAL | Status: DC
  Administered 2016-12-10: 6.25 mg via ORAL
  Filled 2016-12-10: qty 1

## 2016-12-10 MED ORDER — LISINOPRIL 10 MG PO TABS
10.0000 mg | ORAL_TABLET | Freq: Every day | ORAL | Status: DC
  Administered 2016-12-10: 10 mg via ORAL
  Filled 2016-12-10: qty 1

## 2016-12-10 MED FILL — Fentanyl Citrate Preservative Free (PF) Inj 100 MCG/2ML: INTRAMUSCULAR | Qty: 2 | Status: AC

## 2016-12-10 MED FILL — Midazolam HCl Inj 5 MG/5ML (Base Equivalent): INTRAMUSCULAR | Qty: 5 | Status: AC

## 2016-12-10 NOTE — Discharge Instructions (Signed)
° ° °  Supplemental Discharge Instructions for  Pacemaker/Defibrillator Patients  Activity No heavy lifting or vigorous activity with your left/right arm for 6 to 8 weeks.  Do not raise your left/right arm above your head for one week.  Gradually raise your affected arm as drawn below.           __          12/14/16                    12/15/16                    12/16/16                           12/17/16  NO DRIVING    WOUND CARE - Keep the wound area clean and dry.  Do not get this area wet for one week. No showers for one week; you may shower on  12/17/16   . - The tape/steri-strips on your wound will fall off; do not pull them off.  No bandage is needed on the site.  DO  NOT apply any creams, oils, or ointments to the wound area. - If you notice any drainage or discharge from the wound, any swelling or bruising at the site, or you develop a fever > 101? F after you are discharged home, call the office at once.  Special Instructions - You are still able to use cellular telephones; use the ear opposite the side where you have your pacemaker/defibrillator.  Avoid carrying your cellular phone near your device. - When traveling through airports, show security personnel your identification card to avoid being screened in the metal detectors.  Ask the security personnel to use the hand wand. - Avoid arc welding equipment, MRI testing (magnetic resonance imaging), TENS units (transcutaneous nerve stimulators).  Call the office for questions about other devices. - Avoid electrical appliances that are in poor condition or are not properly grounded. - Microwave ovens are safe to be near or to operate.  Additional information for defibrillator patients should your device go off: - If your device goes off ONCE and you feel fine afterward, notify the device clinic nurses. - If your device goes off ONCE and you do not feel well afterward, call 911. - If your device goes off TWICE, call 911. - If your device  goes off THREE times in one day, call 911.  DO NOT DRIVE YOURSELF OR A FAMILY MEMBER WITH A DEFIBRILLATOR TO THE HOSPITAL--CALL 911.

## 2016-12-10 NOTE — Progress Notes (Signed)
Iv removed. No issues present. Reviewed d/c instructions with patient.   Murle Hellstrom, Charlaine Dalton RN

## 2016-12-10 NOTE — Op Note (Signed)
NAME:  DONYE, CAMPANELLI                    ACCOUNT NO.:  MEDICAL RECORD NO.:  [id number]  LOCATION:                                 FACILITY:  PHYSICIAN:  Duke Salvia, MD, [facility]   DATE OF BIRTH:  DATE OF PROCEDURE:  12/09/2016 DATE OF DISCHARGE:                              OPERATIVE REPORT   PREOPERATIVE DIAGNOSIS:  Syncope with nonischemic cardiomyopathy and left bundle-branch block.  POSTOPERATIVE DIAGNOSIS:  Syncope with nonischemic cardiomyopathy and left bundle-branch block.  PROCEDURE:  Implantation of a dual-chamber defibrillator with left ventricular lead placement and high-voltage lead assessment.  DESCRIPTION OF PROCEDURE:  Following obtaining informed consent, the patient was brought to electrophysiology laboratory and placed on the fluoroscopic table in a supine position.  After routine prep and drape of the left upper chest, lidocaine was infiltrated in prepectoral subclavicular region.  Incision was made and carried down to the layer of the prepectoral fascia with electrocautery and sharp dissection.  A pocket was formed similarly.  Hemostasis was obtained.  Thereafter, attention was turned to gain access to the extrathoracic left subclavian vein, which was accomplished without difficulty without the aspiration of air or puncture of the artery.  Three separate venipunctures were accomplished.  Guidewires were placed and retained, and sequentially, a 9, 9.5, and 7-French sheaths were placed which were passed a Medtronic 6935 single coil defibrillator lead, serial #[id number], a [employer] Jude 135 CS cannulation sheath and Medtronic 5076 52 cm active fixation atrial lead, serial #[id number].  Under fluoroscopic guidance, the RV lead was placed in the RV apex, where the bipolar RV amplitude was 8.2 with a pace impedance of 642 with a threshold of 0.9 V at 0.5 milliseconds.  Current threshold was 1.2 mA, and there was no diaphragmatic pacing at 10 V.  The current of  injury was brisk.  This lead was secured to the prepectoral fascia.  We then obtained access to the coronary sinus.  Two lateral vein branches were identified one high lateral and one posterolateral.  We targeted the second initially, but it had a fairly steep takeoff, so we targeted the second one.  This region was 130 milliseconds.  The amplitude was 20.8 with impedance of 882 with a threshold of 0.7 V at 0.5 milliseconds and a 1-2 configuration.  There was no diaphragmatic pacing at 10 V.  There was diaphragmatic pacing at 10 V in the 2-3 configuration.  With the 9.5-French sheath having been removed, the atrial lead was positioned in the right atrial appendage, where the bipolar P-wave was 2.7 with a pace impedance of 1133, threshold 2.8 V at 0.5 milliseconds. Current of injury was brisk and the current was 2.2 milliamps.  This lead was secured to the prepectoral fascia.  We then removed the supporting sheath for the LV lead, secured the LV lead to the prepectoral fascia, and then attached the leads to a Medtronic DTMA Claria MRI compatible device, serial #[id number]. Through the device, the bipolar P-wave was 1.8 with a pace impedance of 703, a threshold of 0.75 V at 0.4 milliseconds.  The R-wave was 16.3 with a pace impedance of 494 and a threshold of 0.75  V at 0.4 milliseconds, and the LV impedance was 817 ohms with a threshold of 0.75 V at 0.4 milliseconds, and the pulse generator then placed in the pocket, secured to the prepectoral fascia.  I should note that the high- voltage impedance was 77 ohms.  The pocket having been copiously irrigated with antibiotic-containing saline solution.  Hemostasis was assured.  It was then closed in 2 layers in a normal fashion.  The wound was washed, dried, and a Dermabond dressing was applied.  Needle counts, sponge counts, and instrument counts were correct at the end of procedure.  The patient tolerated the procedure without apparent  complication.     Duke Salvia, MD, [facility]     SCK/MEDQ  D:  12/09/2016  T:  12/09/2016  Job:  [id number]

## 2016-12-10 NOTE — Discharge Summary (Signed)
Physician Discharge Summary  Jeremy Wagner CWC:[id number] DOB: 1961-07-14 DOA: 12/04/2016  PCP: Georgianne Fick, MD  Admit date: 12/04/2016 Discharge date: 12/10/2016  Admitted From: Home Disposition: Home  Recommendations for Outpatient Follow-up:  1. Follow up with PCP in 1-2 weeks 2. Please obtain BMP/CBC in one week  Home Health: NA Equipment/Devices:NA  Discharge Condition: Stable CODE STATUS: Full Code Diet recommendation: Diet Heart Room service appropriate? Yes; Fluid consistency: Thin Diet - low sodium heart healthy  Brief/Interim Summary: 56 year old African-American male with a past medical history of nonischemic cardiomyopathy with an ejection fraction of 30-35% based on echocardiogram done in 2016. Apparently had a cardiac catheterization in 2009, which revealed normal coronary arteries. He also underwent stress test in 2016, which did not reveal any ischemia. He presented after sudden episode of syncope while he was at work. Due to his cardiac history patient was hospitalized for further evaluation. He was seen by cardiology. ICD was recommended. However, Initially he wants to have LifeVest, EP recommended ICD which is placed on 12/09/2016.  Discharge Diagnoses:  Principal Problem:   Syncope and collapse Active Problems:   Essential hypertension, benign   LBBB (left bundle branch block)   Nonischemic cardiomyopathy (HCC)   Chronic systolic heart failure (HCC)   Anxiety   Syncope -In the setting of nonischemic cardiomyopathy, LVEF rechecked, still decreased at 20-25%. -Patient's EKG shows LBBB which is old for him. Previously seen by EP in 2016. At that time was not thought to require ICD.  -Patient was offered ICD placement. However, he would like to discuss this further with his primary cardiologist.  -ICD placed on 12/09/2016 by Dr. Graciela Husbands. -Vicodin 5/325 mg prescribed (15 pills) for postprocedural pain  Nonischemic cardiomyopathy. -Ejection fraction is  20-25%. Appears to be well compensated at this time.  -Continue home medications including beta blockers and ACE inhibitor.  History of essential hypertension. Stable. Continue home medications.  Mildly elevated creatinine. Resolved. Mildly abnormal UA with leukocytes and nitrite. However, patient denies any symptoms suggestive of UTI. No need for treatment.  Normocytic anemia. Outpatient management by PCP. No overt bleeding noted.   Discharge Instructions  Discharge Instructions    Diet - low sodium heart healthy    Complete by:  As directed    Increase activity slowly    Complete by:  As directed      Allergies as of 12/10/2016   No Known Allergies     Medication List    TAKE these medications   aspirin EC 81 MG tablet Take 1 tablet (81 mg total) by mouth daily.   carvedilol 6.25 MG tablet Commonly known as:  COREG TAKE 1 TABLET BY MOUTH TWICE A DAY WITH MEALS   HYDROcodone-acetaminophen 5-325 MG tablet Commonly known as:  NORCO/VICODIN Take 1-2 tablets by mouth every 4 (four) hours as needed for moderate pain or severe pain.   lisinopril 10 MG tablet Commonly known as:  PRINIVIL,ZESTRIL Take 1 tablet (10 mg total) by mouth daily.   nitroGLYCERIN 0.4 MG SL tablet Commonly known as:  NITROSTAT Place 1 tablet (0.4 mg total) under the tongue every 5 (five) minutes as needed for chest pain.      Follow-up Information    Sherryl Manges, MD Follow up on 12/16/2016.   Specialty:  Cardiology Why:  3:15PM Contact information: [address] [place] [address] [phone]        Lyn Records [name], MD Follow up on 12/21/2016.   Specialty:  Cardiology Why:  10:20AM Contact information: [address] [place] [address] [phone]          No Known Allergies  Consultations:  Treatment Team:   Rounding L[facility]iology, MD   Procedures Implantation of a dual-chamber defibrillator with left ventricular lead  placement and high-voltage lead assessment.(MRI compatible)  Radiological studies: Dg Chest 2 View  Result Date: 12/10/2016 CLINICAL DATA:  Pacemaker. EXAM: CHEST  2 VIEW COMPARISON:  12/05/2016 . FINDINGS: Mediastinum and hilar structures normal. Lungs are clear. AICD noted. Heart size normal. No focal infiltrate. No pleural effusion or pneumothorax. IMPRESSION: 1. AICD noted.  Heart size normal. 2. No acute pulmonary disease. Electronically Signed   By: Maisie Fus  Register   On: 12/10/2016 07:37   Dg Chest 2 View  Result Date: 12/05/2016 CLINICAL DATA:  56 year old male with a history of syncope EXAM: CHEST  2 VIEW COMPARISON:  10/09/2014 FINDINGS: Cardiomediastinal silhouette within normal limits. No evidence of central vascular congestion. No pneumothorax or pleural effusion.  No confluent airspace disease. No displaced fracture. IMPRESSION: No radiographic evidence of acute cardiopulmonary disease Electronically Signed   By: Gilmer Mor D.O.   On: 12/05/2016 13:54     Subjective:  Discharge Exam: Vitals:   12/09/16 1636 12/09/16 1711 12/09/16 2138 12/10/16 0539  BP: 128/88 132/82 138/83 117/73  Pulse: 86 74 80 80  Resp: 17 18 18 18   Temp:  97.9 F (36.6 C) 98 F (36.7 C) 98.5 F (36.9 C)  TempSrc:  Oral Oral Oral  SpO2: 96% 100% 97% 97%  Weight:      Height:       General: Pt is alert, awake, not in acute distress Cardiovascular: RRR, S1/S2 +, no rubs, no gallops Respiratory: CTA bilaterally, no wheezing, no rhonchi Abdominal: Soft, NT, ND, bowel sounds + Extremities: no edema, no cyanosis   The results of significant diagnostics from this hospitalization (including imaging, microbiology, ancillary and laboratory) are listed below for reference.    Microbiology: Recent Results (from the past 240 hour(s))  MRSA PCR Screening     Status: None   Collection Time: 12/09/16  1:09 PM  Result Value Ref Range Status   MRSA by PCR NEGATIVE NEGATIVE Final    Comment:        The  GeneXpert MRSA Assay (FDA approved for NASAL specimens only), is one component of a comprehensive MRSA colonization surveillance program. It is not intended to diagnose MRSA infection nor to guide or monitor treatment for MRSA infections.      Labs: BNP (last 3 results) No results for input(s): BNP in the last 8760 hours. Basic Metabolic Panel:  Recent Labs Lab 12/04/16 1250 12/04/16 1424 12/05/16 0117 12/06/16 0155 12/09/16 0214  NA 139  --  138 138 139  K 4.0  --  3.8 4.0 3.9  CL 106  --  103 103 101  CO2 26  --  26 27 28   GLUCOSE 100*  --  107* 96 94  BUN 9  --  13 12 11   CREATININE 1.12 1.10 1.33* 1.21 1.11  CALCIUM 9.1  --  8.8* 9.1 9.1   Liver Function Tests:  Recent Labs Lab 12/05/16 0117  AST 20  ALT 18  ALKPHOS 60  BILITOT 0.6  PROT 6.3*  ALBUMIN 3.1*   No results for input(s): LIPASE, AMYLASE in the last 168 hours. No results for input(s): AMMONIA in the last 168 hours. CBC:  Recent Labs Lab 12/04/16 1250 12/04/16 1424 12/05/16 0117 12/06/16 0155  WBC 7.5 7.2 6.5 6.3  HGB 11.8* 11.8* 11.3* 12.1*  HCT 35.3* 35.5* 34.6* 36.6*  MCV 93.1 93.4 94.3 93.6  PLT 170 172 164 178   Cardiac Enzymes:  Recent Labs Lab 12/04/16 1424 12/04/16 1956 12/05/16 0117  TROPONINI <0.03 <0.03 <0.03   BNP: Invalid input(s): POCBNP CBG:  Recent Labs Lab 12/04/16 1219 12/05/16 0606 12/06/16 0600 12/08/16 0603 12/09/16 0618  GLUCAP 116* 89 102* 107* 101*   D-Dimer No results for input(s): DDIMER in the last 72 hours. Hgb A1c No results for input(s): HGBA1C in the last 72 hours. Lipid Profile No results for input(s): CHOL, HDL, LDLCALC, TRIG, CHOLHDL, LDLDIRECT in the last 72 hours. Thyroid function studies No results for input(s): TSH, T4TOTAL, T3FREE, THYROIDAB in the last 72 hours.  Invalid input(s): FREET3 Anemia work up No results for input(s): VITAMINB12, FOLATE, FERRITIN, TIBC, IRON, RETICCTPCT in the last 72 hours. Urinalysis     Component Value Date/Time   COLORURINE YELLOW 12/04/2016 1335   APPEARANCEUR HAZY (A) 12/04/2016 1335   LABSPEC 1.009 12/04/2016 1335   PHURINE 6.0 12/04/2016 1335   GLUCOSEU NEGATIVE 12/04/2016 1335   HGBUR MODERATE (A) 12/04/2016 1335   BILIRUBINUR NEGATIVE 12/04/2016 1335   KETONESUR NEGATIVE 12/04/2016 1335   PROTEINUR NEGATIVE 12/04/2016 1335   NITRITE POSITIVE (A) 12/04/2016 1335   LEUKOCYTESUR SMALL (A) 12/04/2016 1335   Sepsis Labs Invalid input(s): PROCALCITONIN,  WBC,  LACTICIDVEN Microbiology Recent Results (from the past 240 hour(s))  MRSA PCR Screening     Status: None   Collection Time: 12/09/16  1:09 PM  Result Value Ref Range Status   MRSA by PCR NEGATIVE NEGATIVE Final    Comment:        The GeneXpert MRSA Assay (FDA approved for NASAL specimens only), is one component of a comprehensive MRSA colonization surveillance program. It is not intended to diagnose MRSA infection nor to guide or monitor treatment for MRSA infections.      Time coordinating discharge: Over 30 minutes  SIGNED:   Clint Lipps, MD  [facility] 12/10/2016, 10:05 AM Pager   If 7PM-7AM, please contact night-coverage [email or web address] Password [email or web address]

## 2016-12-11 ENCOUNTER — Ambulatory Visit (INDEPENDENT_AMBULATORY_CARE_PROVIDER_SITE_OTHER): Payer: Self-pay | Admitting: *Deleted

## 2016-12-11 ENCOUNTER — Telehealth: Payer: Self-pay | Admitting: Internal Medicine

## 2016-12-11 DIAGNOSIS — Z959 Presence of cardiac and vascular implant and graft, unspecified: Secondary | ICD-10-CM

## 2016-12-11 DIAGNOSIS — I428 Other cardiomyopathies: Secondary | ICD-10-CM

## 2016-12-11 DIAGNOSIS — I5032 Chronic diastolic (congestive) heart failure: Secondary | ICD-10-CM

## 2016-12-11 LAB — CUP PACEART INCLINIC DEVICE CHECK
Battery Remaining Longevity: 83 mo
Battery Voltage: 3.1 V
Brady Statistic AP VP Percent: 0.03 %
Brady Statistic AP VS Percent: 0.01 %
Brady Statistic AS VP Percent: 99.91 %
Brady Statistic AS VS Percent: 0.05 %
Brady Statistic RA Percent Paced: 0.03 %
Brady Statistic RV Percent Paced: 99.91 %
Date Time Interrogation Session: 20180330165906
HighPow Impedance: 57 Ohm
Implantable Lead Implant Date: 20180328
Implantable Lead Implant Date: 20180328
Implantable Lead Implant Date: 20180328
Implantable Lead Location: 753858
Implantable Lead Location: 753859
Implantable Lead Location: 753860
Implantable Lead Model: 5076
Implantable Pulse Generator Implant Date: 20180328
Lead Channel Impedance Value: 180 Ohm
Lead Channel Impedance Value: 180 Ohm
Lead Channel Impedance Value: 180 Ohm
Lead Channel Impedance Value: 180 Ohm
Lead Channel Impedance Value: 190 Ohm
Lead Channel Impedance Value: 342 Ohm
Lead Channel Impedance Value: 342 Ohm
Lead Channel Impedance Value: 380 Ohm
Lead Channel Impedance Value: 380 Ohm
Lead Channel Impedance Value: 380 Ohm
Lead Channel Impedance Value: 399 Ohm
Lead Channel Impedance Value: 494 Ohm
Lead Channel Impedance Value: 532 Ohm
Lead Channel Impedance Value: 589 Ohm
Lead Channel Impedance Value: 627 Ohm
Lead Channel Impedance Value: 627 Ohm
Lead Channel Impedance Value: 646 Ohm
Lead Channel Impedance Value: 665 Ohm
Lead Channel Pacing Threshold Amplitude: 0.5 V
Lead Channel Pacing Threshold Amplitude: 0.75 V
Lead Channel Pacing Threshold Amplitude: 1 V
Lead Channel Pacing Threshold Pulse Width: 0.4 ms
Lead Channel Pacing Threshold Pulse Width: 0.4 ms
Lead Channel Pacing Threshold Pulse Width: 0.4 ms
Lead Channel Sensing Intrinsic Amplitude: 0.875 mV
Lead Channel Sensing Intrinsic Amplitude: 13.875 mV
Lead Channel Setting Pacing Amplitude: 3.5 V
Lead Channel Setting Pacing Amplitude: 3.5 V
Lead Channel Setting Pacing Amplitude: 3.5 V
Lead Channel Setting Pacing Pulse Width: 0.4 ms
Lead Channel Setting Pacing Pulse Width: 0.4 ms
Lead Channel Setting Sensing Sensitivity: 0.3 mV

## 2016-12-11 NOTE — Telephone Encounter (Signed)
Wife called stating that the patient is c/o thumping of his diaphragm. Appt made for today @ 1530. Wife voiced understanding.

## 2016-12-11 NOTE — Progress Notes (Signed)
CRT-D check in clinic for diaphragmatic stim. No diaphragmatic stim noted in LV1 to LV4, threshold 1.0V @ 0.55ms, reprogrammed LV pace polarity from LV1 to LV2 to LV1 to LV4. Patient encouraged to call the Device Clinic if he continues to experience stim. Patient and family verbalize understanding. ROV with Device Clinic on 12/21/16 for wound check.  Quick incision check--Dermabond remains in place until wound check, wound edges appear well approximated. No drainage or redness noted.

## 2016-12-11 NOTE — Telephone Encounter (Signed)
New message      Pt has a defibulator put in recently.  Pt states he is having discomfort on the side the defib is on.  This started this am.  Pt came home yesterday.  Please advise

## 2016-12-16 ENCOUNTER — Ambulatory Visit: Payer: BC Managed Care – PPO | Admitting: Internal Medicine

## 2016-12-18 ENCOUNTER — Encounter: Payer: Self-pay | Admitting: Interventional Cardiology

## 2016-12-19 DIAGNOSIS — Z95810 Presence of automatic (implantable) cardiac defibrillator: Secondary | ICD-10-CM | POA: Insufficient documentation

## 2016-12-19 NOTE — Progress Notes (Signed)
Cardiology Office Note    Date:  12/22/2016   ID:  Jeremy Wagner, DOB 05/19/1961, MRN [id number]  PCP:  Jeremy Fick, MD  Cardiologist: Jeremy Noe, MD   Chief Complaint  Patient presents with  . Loss of Consciousness  . Hospitalization Follow-up    AICD implant    History of Present Illness:  Jeremy Wagner is a 56 y.o. male with hypertension, nonobstructive coronary disease, long-standing history of LV systolic dysfunction, left bundle branch block, nonischemic cardiomyopathy, syncope 2018, and recent AICD implantation.  In late March, Mr. Jeremy Wagner was admitted to the hospital after collapsing at work. He is known to have nonobstructive coronary disease with nonischemic cardiomyopathy. No arrhythmias were documented. CPR was not necessary. Previous assessment of LV function revealed EF 30-35%. Acute evaluation this admission revealed an EF having dropped to 20-25%. He will rule out for myocardial infarction. He was evaluated for AICD and ultimately had the device placed at the end of March. He is doing okay at home. He is not able to drive because of syncope. His wife accompanies him today. Some soreness in the pacer/AICD site in the left subclavicular area. No shortness of breath, device treatment, or syncope has occurred since discharge.  Past Medical History:  Diagnosis Date  . Anxiety   . Chronic systolic CHF (congestive heart failure) (HCC)   . GERD (gastroesophageal reflux disease)   . Heart murmur   . Hematuria   . Hypertension   . Insomnia   . LBBB (left bundle branch block)   . Nonischemic cardiomyopathy (HCC)    a. LVEF 35-40% by cath 01-13-08. Normal coronary arteries. b. 2D echo 2016: EF 30-35%.    Past Surgical History:  Procedure Laterality Date  . BIV ICD INSERTION CRT-D N/A 12/09/2016   Procedure: BiV ICD Insertion CRT-D;  Surgeon: Duke Salvia, MD;  Location: [facility] INVASIVE CV LAB;  Service: Cardiovascular;  Laterality: N/A;  . CARDIAC  CATHETERIZATION  01/2008   clean Coronaries, HTN CM??; EF ~30-35%    Current Medications: Outpatient Medications Prior to Visit  Medication Sig Dispense Refill  . aspirin EC 81 MG tablet Take 1 tablet (81 mg total) by mouth daily. 30 tablet 0  . carvedilol (COREG) 6.25 MG tablet TAKE 1 TABLET BY MOUTH TWICE A DAY WITH MEALS 180 tablet 0  . HYDROcodone-acetaminophen (NORCO/VICODIN) 5-325 MG tablet Take 1-2 tablets by mouth every 4 (four) hours as needed for moderate pain or severe pain. 15 tablet 0  . lisinopril (PRINIVIL,ZESTRIL) 10 MG tablet Take 1 tablet (10 mg total) by mouth daily. 90 tablet 0  . nitroGLYCERIN (NITROSTAT) 0.4 MG SL tablet Place 1 tablet (0.4 mg total) under the tongue every 5 (five) minutes as needed for chest pain. 25 tablet 5   No facility-administered medications prior to visit.      Allergies:   Patient has no known allergies.   Social History   Social History  . Marital status: Married    Spouse name: N/A  . Number of children: N/A  . Years of education: N/A   Social History Main Topics  . Smoking status: Never Smoker  . Smokeless tobacco: Never Used  . Alcohol use No  . Drug use: No  . Sexual activity: Not on file   Other Topics Concern  . Not on file   Social History Narrative   Works for the city   Physically very active job.   Has 2 summer jobs both of which are  very physical   [place] a lot   Drinks ~ 24 Oz of caffeine in am   3 kids   Married   Lives in [place] [employer]   Never smoker     Family History:  The patient's family history includes CAD (age of onset: 29) in his paternal aunt; Coronary artery disease (age of onset: 68) in his paternal uncle; Hypertension in his father and mother.   ROS:   Please see the history of present illness.    Anxiety concerning his independence. He wants to be able to return to work. He is concerned about income for his family. Not sleeping well.  All other systems reviewed and are  negative.   PHYSICAL EXAM:   VS:  BP 124/76   Pulse (!) 55   Ht 5\' 11"  (1.803 m)   Wt 162 lb (73.5 kg)   BMI 22.59 kg/m    GEN: Well nourished, well developed, in no acute distress  HEENT: normal  Neck: no JVD, carotid bruits, or masses Cardiac: RRR; no murmurs, rubs, or gallops,no edema . Device site is healing well. Respiratory:  clear to auscultation bilaterally, normal work of breathing GI: soft, nontender, nondistended, + BS MS: no deformity or atrophy  Skin: warm and dry, no rash Neuro:  Alert and Oriented x 3, Strength and sensation are intact Psych: euthymic mood, full affect  Wt Readings from Last 3 Encounters:  12/21/16 162 lb (73.5 kg)  12/09/16 161 lb 12.8 oz (73.4 kg)  12/27/15 162 lb 3.2 oz (73.6 kg)      Studies/Labs Reviewed:   EKG:  EKG  Not repeated  Recent Labs: 12/04/2016: TSH 1.151 12/05/2016: ALT 18 12/06/2016: Hemoglobin 12.1; Platelets 178 12/09/2016: BUN 11; Creatinine, Ser 1.11; Potassium 3.9; Sodium 139   Lipid Panel No results found for: CHOL, TRIG, HDL, CHOLHDL, VLDL, LDLCALC, LDLDIRECT  Additional studies/ records that were reviewed today include:  ECHOCARDIOGRAM 12/06/16: Study Conclusions  - Left ventricle: The cavity size was normal. Wall thickness was   increased in a pattern of mild LVH. Systolic function was   severely reduced. The estimated ejection fraction was in the   range of 20% to 25%. Diffuse hypokinesis. Doppler parameters are   consistent with abnormal left ventricular relaxation (grade 1   diastolic dysfunction). - Regional wall motion abnormality: Akinesis of the mid anterior   and basal-mid anteroseptal myocardium. - Aortic valve: Mildly calcified annulus. Trileaflet; normal   thickness leaflets. Valve area (VTI): 1.69 cm^2. Valve area   (Vmax): 1.93 cm^2. Valve area (Vmean): 1.83 cm^2. - Left atrium: The atrium was mildly dilated. - Atrial septum: No defect or patent foramen ovale was identified. - Technically  adequate study.   ASSESSMENT:    1. Syncope and collapse   2. Nonischemic cardiomyopathy (HCC)   3. LBBB (left bundle branch block)   4. Chronic systolic heart failure (HCC)   5. 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 AICD (automatic cardioverter/defibrillator) present      PLAN:  In order of problems listed above:  1. Assumption is that it was related to ventricular arrhythmia in the setting of nonischemic cardiomyopathy. No recurrence since placement of AICD. A more benign explanation for the episode was dehydration and overwork, high however considering LV function the issue of malignant arrhythmia is the greatest concern and threatening going forward. 2. Heart failure  therapy will be optimized current therapy does include ACE and beta blocker therapy. The device placed was a resynchronization device with a left ventricular lead. Hopefully LV function will improve because of this. 3. Not addressed 4. No evidence of volume overload. Optimize heart failure therapy over the next 3-6 months. 5. Blood pressure is controlled kidney function is normal. 6. We'll follow-up in device clinic soon.  Clinical follow-up with me in 3-4 months. At that time further optimization of heart failure therapy will be considered. Possibly using Sherryll Burger will also be entertained.    Medication Adjustments/Labs and Tests Ordered: Current medicines are reviewed at length with the patient today.  Concerns regarding medicines are outlined above.  Medication changes, Labs and Tests ordered today are listed in the Patient Instructions below. Patient Instructions  Medication Instructions:  None  Labwork: None  Testing/Procedures: None  Follow-Up: Your physician wants you to follow-up in: 4 months with Dr. [name].  You will receive a reminder letter in the mail two months in advance. If you  don't receive a letter, please call our office to schedule the follow-up appointment.   Any Other Special Instructions Will Be Listed Below (If Applicable).     If you need a refill on your cardiac medications before your next appointment, please call your pharmacy.      Signed, Jeremy Noe, MD  12/22/2016 7:58 AM    [facility] [address] [place], [place], [place]  [address] Phone: [phone]; Fax: [phone]

## 2016-12-21 ENCOUNTER — Ambulatory Visit (INDEPENDENT_AMBULATORY_CARE_PROVIDER_SITE_OTHER): Payer: BC Managed Care – PPO | Admitting: Interventional Cardiology

## 2016-12-21 ENCOUNTER — Encounter (INDEPENDENT_AMBULATORY_CARE_PROVIDER_SITE_OTHER): Payer: Self-pay

## 2016-12-21 ENCOUNTER — Ambulatory Visit: Payer: BC Managed Care – PPO

## 2016-12-21 ENCOUNTER — Encounter: Payer: Self-pay | Admitting: *Deleted

## 2016-12-21 VITALS — BP 124/76 | HR 55 | Ht 71.0 in | Wt 162.0 lb

## 2016-12-21 DIAGNOSIS — I428 Other cardiomyopathies: Secondary | ICD-10-CM | POA: Diagnosis not present

## 2016-12-21 DIAGNOSIS — I130 Hypertensive heart and chronic kidney disease with heart failure and stage 1 through stage 4 chronic kidney disease, or unspecified chronic kidney disease: Secondary | ICD-10-CM | POA: Diagnosis not present

## 2016-12-21 DIAGNOSIS — I447 Left bundle-branch block, unspecified: Secondary | ICD-10-CM

## 2016-12-21 DIAGNOSIS — R55 Syncope and collapse: Secondary | ICD-10-CM | POA: Diagnosis not present

## 2016-12-21 DIAGNOSIS — I5022 Chronic systolic (congestive) heart failure: Secondary | ICD-10-CM | POA: Diagnosis not present

## 2016-12-21 DIAGNOSIS — Z95810 Presence of automatic (implantable) cardiac defibrillator: Secondary | ICD-10-CM | POA: Diagnosis not present

## 2016-12-21 NOTE — Patient Instructions (Signed)
Medication Instructions:  None  Labwork: None  Testing/Procedures: None  Follow-Up: Your physician wants you to follow-up in: 4 months with Dr. Smith.  You will receive a reminder letter in the mail two months in advance. If you don't receive a letter, please call our office to schedule the follow-up appointment.   Any Other Special Instructions Will Be Listed Below (If Applicable).     If you need a refill on your cardiac medications before your next appointment, please call your pharmacy.   

## 2016-12-22 ENCOUNTER — Encounter: Payer: Self-pay | Admitting: Interventional Cardiology

## 2016-12-23 ENCOUNTER — Ambulatory Visit (INDEPENDENT_AMBULATORY_CARE_PROVIDER_SITE_OTHER): Payer: BC Managed Care – PPO | Admitting: *Deleted

## 2016-12-23 DIAGNOSIS — I428 Other cardiomyopathies: Secondary | ICD-10-CM

## 2016-12-24 LAB — CUP PACEART INCLINIC DEVICE CHECK
Brady Statistic AP VP Percent: 0.1 % — CL
Brady Statistic AP VS Percent: 0.1 % — CL
Brady Statistic AS VP Percent: 99.8 %
Brady Statistic AS VS Percent: 0.1 % — CL
Date Time Interrogation Session: 20180412074352
HighPow Impedance: 54 Ohm
Implantable Lead Implant Date: 20180328
Implantable Lead Implant Date: 20180328
Implantable Lead Implant Date: 20180328
Implantable Lead Location: 753858
Implantable Lead Location: 753859
Implantable Lead Location: 753860
Implantable Lead Model: 5076
Implantable Pulse Generator Implant Date: 20180328
Lead Channel Impedance Value: 342 Ohm
Lead Channel Impedance Value: 494 Ohm
Lead Channel Impedance Value: 760 Ohm
Lead Channel Pacing Threshold Amplitude: 0.5 V
Lead Channel Pacing Threshold Amplitude: 0.75 V
Lead Channel Pacing Threshold Amplitude: 1 V
Lead Channel Pacing Threshold Pulse Width: 0.4 ms
Lead Channel Pacing Threshold Pulse Width: 0.4 ms
Lead Channel Pacing Threshold Pulse Width: 0.4 ms
Lead Channel Sensing Intrinsic Amplitude: 1.9 mV
Lead Channel Sensing Intrinsic Amplitude: 13.4 mV

## 2016-12-24 NOTE — Progress Notes (Signed)
Wound check appointment. Dermabond removed. Wound without redness or edema. Right Lateral incision site superficially approximated steri strips applied. Normal device function. Thresholds, sensing, and impedances consistent with implant measurements. Device programmed at 3.5V for extra safety margin until 3 month visit. Histogram distribution appropriate for patient and level of activity. 4 AT/AF longest . <0.1% AT/AF. No ventricular arrhythmias noted. Patient educated about wound care, arm mobility, lifting restrictions, shock plan. ROV w/ device clinic 11/29/16 for wound re-check.

## 2016-12-28 ENCOUNTER — Telehealth: Payer: Self-pay | Admitting: Interventional Cardiology

## 2016-12-28 NOTE — Telephone Encounter (Signed)
New message      Calling to give you the date to put on the paperwork.  It is Jan 21, 2017.  Please call when ready and he will come today and pick it up.

## 2016-12-28 NOTE — Telephone Encounter (Signed)
Spoke with pt and advised him paperwork was ready to be picked up.  Pt verbalized understanding and was appreciative for call.

## 2016-12-29 ENCOUNTER — Encounter: Payer: Self-pay | Admitting: *Deleted

## 2016-12-29 ENCOUNTER — Encounter: Payer: Self-pay | Admitting: Internal Medicine

## 2016-12-30 ENCOUNTER — Ambulatory Visit (INDEPENDENT_AMBULATORY_CARE_PROVIDER_SITE_OTHER): Payer: Self-pay | Admitting: *Deleted

## 2016-12-30 DIAGNOSIS — I428 Other cardiomyopathies: Secondary | ICD-10-CM

## 2016-12-30 DIAGNOSIS — Z95810 Presence of automatic (implantable) cardiac defibrillator: Secondary | ICD-10-CM

## 2016-12-30 NOTE — Progress Notes (Signed)
Jeremy Wagner to device clinic today for wound check. Steri-strips removed from patient's left lateral incision. Incision edges approximated and without redness, swelling or drainage. Jeremy Wagner questioned paperwork for work/leafblower use. I am unaware of such paperwork. I provided patient instructions with physical limitations to patient.   Paperwork later found on co-workers desk- pt made aware by phone that I will mail him the letter. He verbalizes understanding. ROV with SK 03/18/17.

## 2016-12-31 ENCOUNTER — Other Ambulatory Visit: Payer: Self-pay | Admitting: Interventional Cardiology

## 2017-01-01 ENCOUNTER — Telehealth: Payer: Self-pay | Admitting: Interventional Cardiology

## 2017-01-01 ENCOUNTER — Other Ambulatory Visit: Payer: Self-pay | Admitting: *Deleted

## 2017-01-01 NOTE — Telephone Encounter (Signed)
Spoke with patient's wife (DPR on file) and she states that she picked up the papers the other day for her husband to return to work. She states that on the form it was checked "no" for incapacitated and it needs to be checked "yes". She also states that underneath that that the date of surgery (12/04/16) to the return to work date (01/21/17 per wife) needs to be filled out. Wife is going to drop off papers for Dr. [name] to correct. Wife made aware that Dr. [name] and his nurse are out of the office today and will return on Monday to address. Wife verbalized understanding.

## 2017-01-01 NOTE — Telephone Encounter (Signed)
New message     Pt needs a return to work form filled out so he can turn it back into his job

## 2017-01-01 NOTE — Telephone Encounter (Signed)
Patient dropped off paperwork to be corrected and filled out. Papers placed in Dr. Michaelle Copas box. Patient made aware that he will be called once the paperwork is completed. Patient verbalized understanding.

## 2017-03-18 ENCOUNTER — Ambulatory Visit (INDEPENDENT_AMBULATORY_CARE_PROVIDER_SITE_OTHER): Payer: BC Managed Care – PPO | Admitting: Internal Medicine

## 2017-03-18 VITALS — BP 120/70 | HR 72 | Ht 70.0 in | Wt 170.0 lb

## 2017-03-18 DIAGNOSIS — I5022 Chronic systolic (congestive) heart failure: Secondary | ICD-10-CM

## 2017-03-18 DIAGNOSIS — I447 Left bundle-branch block, unspecified: Secondary | ICD-10-CM

## 2017-03-18 DIAGNOSIS — Z95810 Presence of automatic (implantable) cardiac defibrillator: Secondary | ICD-10-CM | POA: Diagnosis not present

## 2017-03-18 DIAGNOSIS — I428 Other cardiomyopathies: Secondary | ICD-10-CM | POA: Diagnosis not present

## 2017-03-18 MED ORDER — LISINOPRIL 10 MG PO TABS: 10.0000 mg | ORAL_TABLET | Freq: Every day | ORAL | 2 refills | Status: DC

## 2017-03-18 MED ORDER — CARVEDILOL 6.25 MG PO TABS: 6.2500 mg | ORAL_TABLET | Freq: Two times a day (BID) | ORAL | 3 refills | Status: DC

## 2017-03-18 NOTE — Patient Instructions (Signed)
Medication Instructions: - Your physician recommends that you continue on your current medications as directed. Please refer to the Current Medication list given to you today.  Labwork: - none ordered  Procedures/Testing: - none ordered  Follow-Up: - Remote monitoring is used to monitor your Pacemaker of ICD from home. This monitoring reduces the number of office visits required to check your device to one time per year. It allows [place] to keep an eye on the functioning of your device to ensure it is working properly. You are scheduled for a device check from home on 06/17/17. You may send your transmission at any time that day. If you have a wireless device, the transmission will be sent automatically. After your physician reviews your transmission, you will receive a postcard with your next transmission date.  - Your physician recommends that you schedule a follow-up appointment in: August with Dr. [name] (per recall)  - Your physician wants you to follow-up in: March 2019 with Gypsy Balsam, NP for Dr. Graciela Husbands. You will receive a reminder letter in the mail two months in advance. If you don't receive a letter, please call our office to schedule the follow-up appointment.   Any Additional Special Instructions Will Be Listed Below (If Applicable).     If you need a refill on your cardiac medications before your next appointment, please call your pharmacy.

## 2017-03-18 NOTE — Progress Notes (Signed)
      Patient Care Team: Georgianne Fick, MD as PCP - General (Internal Medicine)   HPI  Jeremy Wagner is a 56 y.o. male Seen in follow-up for syncope with left bundle-branch block and nonischemic cardiomyopathy. He underwent ICD implantation 3/18.  No interval syncope  The patient denies chest pain, shortness of breath, nocturnal dyspnea, orthopnea or peripheral edema.  There have been no palpitations, lightheadedness or syncope.    Records and Results Reviewed   Past Medical History:  Diagnosis Date  . Anxiety   . Chronic systolic CHF (congestive heart failure) (HCC)   . GERD (gastroesophageal reflux disease)   . Heart murmur   . Hematuria   . Hypertension   . Insomnia   . LBBB (left bundle branch block)   . Nonischemic cardiomyopathy (HCC)    a. LVEF 35-40% by cath 01-13-08. Normal coronary arteries. b. 2D echo 2016: EF 30-35%.    Past Surgical History:  Procedure Laterality Date  . BIV ICD INSERTION CRT-D N/A 12/09/2016   Procedure: BiV ICD Insertion CRT-D;  Surgeon: Duke Salvia, MD;  Location: [facility] INVASIVE CV LAB;  Service: Cardiovascular;  Laterality: N/A;  . CARDIAC CATHETERIZATION  01/2008   clean Coronaries, HTN CM??; EF ~30-35%    Current Outpatient Prescriptions  Medication Sig Dispense Refill  . aspirin EC 81 MG tablet Take 1 tablet (81 mg total) by mouth daily. 30 tablet 0  . carvedilol (COREG) 6.25 MG tablet TAKE 1 TABLET BY MOUTH TWICE A DAY WITH MEALS 180 tablet 0  . HYDROcodone-acetaminophen (NORCO/VICODIN) 5-325 MG tablet Take 1-2 tablets by mouth every 4 (four) hours as needed for moderate pain or severe pain. 15 tablet 0  . lisinopril (PRINIVIL,ZESTRIL) 10 MG tablet TAKE 1 TABLET BY MOUTH DAILY 30 tablet 11  . nitroGLYCERIN (NITROSTAT) 0.4 MG SL tablet Place 1 tablet (0.4 mg total) under the tongue every 5 (five) minutes as needed for chest pain. 25 tablet 5   No current facility-administered medications for this visit.     No Known  Allergies    Review of Systems negative except from HPI and PMH  Physical Exam There were no vitals taken for this visit. Well developed and well nourished in no acute distress HENT normal E scleral and icterus clear Neck Supple JVP flat; carotids brisk and full Clear to ausculation Device pocket well healed; without hematoma or erythema.  There is no tethering Regular rate and rhythm, no murmurs gallops or rub Soft with active bowel sounds No clubbing cyanosis  Edema Alert and oriented, grossly normal motor and sensory function Skin Warm and Dry  ECG  Sinus with P-synchronous/ AV  pacing QRS upright V1  Assessment and  Plan NICM  LBBB  Syncope  CRT-D  Medtronic   SVT  Euvolemic continue current meds  Tolerating meds well,  Will ask Dr [facility] re aldactone/entresto  SVT without symptoms        Current medicines are reviewed at length with the patient today .  The patient does not  have concerns regarding medicines.

## 2017-03-19 NOTE — Addendum Note (Signed)
Addended by: Dareen Piano on: 03/19/2017 12:41 PM   Modules accepted: Orders

## 2017-04-25 NOTE — Progress Notes (Deleted)
Cardiology Office Note    Date:  04/25/2017   ID:  Jeremy Wagner, DOB [birth year], MRN [id number]  PCP:  Georgianne Fick, MD  Cardiologist: Lesleigh Noe, MD   No chief complaint on file.   History of Present Illness:  Jeremy Wagner is a 56 y.o. male with hypertension, nonobstructive coronary disease, long-standing history of LV systolic dysfunction, left bundle branch block, nonischemic cardiomyopathy, syncope 2018, and recent AICD implantation.    Past Medical History:  Diagnosis Date  . Anxiety   . Chronic systolic CHF (congestive heart failure) (HCC)   . GERD (gastroesophageal reflux disease)   . Heart murmur   . Hematuria   . Hypertension   . Insomnia   . LBBB (left bundle branch block)   . Nonischemic cardiomyopathy (HCC)    a. LVEF 35-40% by cath 01-13-08. Normal coronary arteries. b. 2D echo 2016: EF 30-35%.    Past Surgical History:  Procedure Laterality Date  . BIV ICD INSERTION CRT-D N/A 12/09/2016   Procedure: BiV ICD Insertion CRT-D;  Surgeon: Duke Salvia, MD;  Location: [facility] INVASIVE CV LAB;  Service: Cardiovascular;  Laterality: N/A;  . CARDIAC CATHETERIZATION  01/2008   clean Coronaries, HTN CM??; EF ~30-35%    Current Medications: Outpatient Medications Prior to Visit  Medication Sig Dispense Refill  . aspirin EC 81 MG tablet Take 1 tablet (81 mg total) by mouth daily. 30 tablet 0  . carvedilol (COREG) 6.25 MG tablet Take 1 tablet (6.25 mg total) by mouth 2 (two) times daily with a meal. 180 tablet 3  . HYDROcodone-acetaminophen (NORCO/VICODIN) 5-325 MG tablet Take 1-2 tablets by mouth every 4 (four) hours as needed for moderate pain or severe pain. 15 tablet 0  . lisinopril (PRINIVIL,ZESTRIL) 10 MG tablet Take 1 tablet (10 mg total) by mouth daily. 90 tablet 2  . nitroGLYCERIN (NITROSTAT) 0.4 MG SL tablet Place 1 tablet (0.4 mg total) under the tongue every 5 (five) minutes as needed for chest pain. 25 tablet 5   No facility-administered  medications prior to visit.      Allergies:   Patient has no known allergies.   Social History   Social History  . Marital status: Married    Spouse name: N/A  . Number of children: N/A  . Years of education: N/A   Social History Main Topics  . Smoking status: Never Smoker  . Smokeless tobacco: Never Used  . Alcohol use No  . Drug use: No  . Sexual activity: Not on file   Other Topics Concern  . Not on file   Social History Narrative   Works for the city   Physically very active job.   Has 2 summer jobs both of which are very physical   [place] a lot   Drinks ~ 24 Oz of caffeine in am   3 kids   Married   Lives in [place] [employer]   Never smoker     Family History:  The patient's ***family history includes CAD (age of onset: 64) in his paternal aunt; Coronary artery disease (age of onset: 69) in his paternal uncle; Hypertension in his father and mother.   ROS:   Please see the history of present illness.    ***  All other systems reviewed and are negative.   PHYSICAL EXAM:   VS:  There were no vitals taken for this visit.   GEN: Well nourished, well developed, in no acute distress  HEENT: normal  Neck: no JVD, carotid bruits, or masses Cardiac: ***RRR; no murmurs, rubs, or gallops,no edema  Respiratory:  clear to auscultation bilaterally, normal work of breathing GI: soft, nontender, nondistended, + BS MS: no deformity or atrophy  Skin: warm and dry, no rash Neuro:  Alert and Oriented x 3, Strength and sensation are intact Psych: euthymic mood, full affect  Wt Readings from Last 3 Encounters:  03/18/17 170 lb (77.1 kg)  12/21/16 162 lb (73.5 kg)  12/09/16 161 lb 12.8 oz (73.4 kg)      Studies/Labs Reviewed:   EKG:  EKG  ***  Recent Labs: 12/04/2016: TSH 1.151 12/05/2016: ALT 18 12/06/2016: Hemoglobin 12.1; Platelets 178 12/09/2016: BUN 11; Creatinine, Ser 1.11; Potassium 3.9; Sodium 139   Lipid Panel No results found for: CHOL, TRIG, HDL,  CHOLHDL, VLDL, LDLCALC, LDLDIRECT  Additional studies/ records that were reviewed today include:  ***    ASSESSMENT:    1. Syncope and collapse   2. Chronic systolic heart failure (HCC)   3. LBBB (left bundle branch block)   4. Nonischemic cardiomyopathy (HCC)   5. 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 AICD (automatic cardioverter/defibrillator) present      PLAN:  In order of problems listed above:  1. ***    Medication Adjustments/Labs and Tests Ordered: Current medicines are reviewed at length with the patient today.  Concerns regarding medicines are outlined above.  Medication changes, Labs and Tests ordered today are listed in the Patient Instructions below. There are no Patient Instructions on file for this visit.   Signed, Lesleigh Noe, MD  04/25/2017 7:24 PM    [facility] [address] [place], [place], [place]  [address] Phone: [phone]; Fax: [phone]

## 2017-04-26 ENCOUNTER — Ambulatory Visit: Payer: BC Managed Care – PPO | Admitting: Interventional Cardiology

## 2017-04-28 ENCOUNTER — Encounter: Payer: Self-pay | Admitting: Interventional Cardiology

## 2017-07-08 ENCOUNTER — Encounter: Payer: Self-pay | Admitting: Interventional Cardiology

## 2017-07-08 ENCOUNTER — Ambulatory Visit (INDEPENDENT_AMBULATORY_CARE_PROVIDER_SITE_OTHER): Payer: BC Managed Care – PPO | Admitting: Interventional Cardiology

## 2017-07-08 VITALS — BP 144/96 | HR 62 | Ht 71.0 in | Wt 174.0 lb

## 2017-07-08 DIAGNOSIS — I5022 Chronic systolic (congestive) heart failure: Secondary | ICD-10-CM

## 2017-07-08 DIAGNOSIS — I209 Angina pectoris, unspecified: Secondary | ICD-10-CM

## 2017-07-08 DIAGNOSIS — Z95810 Presence of automatic (implantable) cardiac defibrillator: Secondary | ICD-10-CM | POA: Diagnosis not present

## 2017-07-08 DIAGNOSIS — I447 Left bundle-branch block, unspecified: Secondary | ICD-10-CM

## 2017-07-08 DIAGNOSIS — I130 Hypertensive heart and chronic kidney disease with heart failure and stage 1 through stage 4 chronic kidney disease, or unspecified chronic kidney disease: Secondary | ICD-10-CM

## 2017-07-08 DIAGNOSIS — R079 Chest pain, unspecified: Secondary | ICD-10-CM | POA: Diagnosis not present

## 2017-07-08 MED ORDER — NITROGLYCERIN 0.4 MG SL SUBL: 0.4000 mg | SUBLINGUAL_TABLET | SUBLINGUAL | 5 refills | Status: DC | PRN

## 2017-07-08 NOTE — Patient Instructions (Signed)
Medication Instructions:  Your physician recommends that you continue on your current medications as directed. Please refer to the Current Medication list given to you today.  The proper use and anticipated side effects of nitroglycerine has been carefully explained.  If a single episode of chest pain is not relieved by one tablet, the patient will try another within 5 minutes; and if this doesn't relieve the pain, the patient is instructed to call 911 for transportation to an emergency department.  Labwork: None  Testing/Procedures: Your physician has requested that you have a lexiscan myoview. For further information please visit [email or web address] Please follow instruction sheet, as given.   Follow-Up: Your physician wants you to follow-up in: 6 months with Dr. [name]. You will receive a reminder letter in the mail two months in advance. If you don't receive a letter, please call our office to schedule the follow-up appointment.   Any Other Special Instructions Will Be Listed Below (If Applicable).     If you need a refill on your cardiac medications before your next appointment, please call your pharmacy.

## 2017-07-08 NOTE — Progress Notes (Signed)
Cardiology Office Note    Date:  07/08/2017   ID:  Jeremy Wagner, DOB 1961-02-11, MRN [id number]  PCP:  Georgianne Fick, MD  Cardiologist: Lesleigh Noe, MD   Chief Complaint  Patient presents with  . Chest Pain  . Coronary Artery Disease    History of Present Illness:  Jeremy Wagner is a 56 y.o. male with hypertension, nonobstructive coronary disease, long-standing history of LV systolic dysfunction, left bundle branch block, nonischemic cardiomyopathy, syncope 2018, and recent AICD implantation.  Jeremy Wagner seems to be doing well. He had AICD placed earlier this year. He has not had a discharge.  On 2 occasions since September he has had chest pressure with radiation into the left arm. The first occasion occurred while working riding a lot more. He developed a funny feeling in both arms with tightness in the chest. There was mild shortness of breath. Rest relieves the discomfort. He has had no recurrence of discomfort with activity while at work. On that particular day he was riding a lot more and was at an angle and was tightly gripping the steering will when the discomfort started. The second episode of discomfort occurred while driving his son back to [place].It was very mild, lasted less than 5 minutes and resolved. He was concerned however because it had a similar quality to that which occurred while working.   Past Medical History:  Diagnosis Date  . Anxiety   . Chronic systolic CHF (congestive heart failure) (HCC)   . GERD (gastroesophageal reflux disease)   . Heart murmur   . Hematuria   . Hypertension   . Insomnia   . LBBB (left bundle branch block)   . Nonischemic cardiomyopathy (HCC)    a. LVEF 35-40% by cath 01-13-08. Normal coronary arteries. b. 2D echo 2016: EF 30-35%.    Past Surgical History:  Procedure Laterality Date  . BIV ICD INSERTION CRT-D N/A 12/09/2016   Procedure: BiV ICD Insertion CRT-D;  Surgeon: Duke Salvia, MD;  Location: [facility] INVASIVE CV  LAB;  Service: Cardiovascular;  Laterality: N/A;  . CARDIAC CATHETERIZATION  01/2008   clean Coronaries, HTN CM??; EF ~30-35%    Current Medications: Outpatient Medications Prior to Visit  Medication Sig Dispense Refill  . aspirin EC 81 MG tablet Take 1 tablet (81 mg total) by mouth daily. 30 tablet 0  . carvedilol (COREG) 6.25 MG tablet Take 1 tablet (6.25 mg total) by mouth 2 (two) times daily with a meal. 180 tablet 3  . HYDROcodone-acetaminophen (NORCO/VICODIN) 5-325 MG tablet Take 1-2 tablets by mouth every 4 (four) hours as needed for moderate pain or severe pain. 15 tablet 0  . lisinopril (PRINIVIL,ZESTRIL) 10 MG tablet Take 1 tablet (10 mg total) by mouth daily. 90 tablet 2  . nitroGLYCERIN (NITROSTAT) 0.4 MG SL tablet Place 1 tablet (0.4 mg total) under the tongue every 5 (five) minutes as needed for chest pain. 25 tablet 5   No facility-administered medications prior to visit.      Allergies:   Patient has no known allergies.   Social History   Social History  . Marital status: Married    Spouse name: N/A  . Number of children: N/A  . Years of education: N/A   Social History Main Topics  . Smoking status: Never Smoker  . Smokeless tobacco: Never Used  . Alcohol use No  . Drug use: No  . Sexual activity: Not Asked   Other Topics Concern  . None  Social History Narrative   Works for the city   Physically very active job.   Has 2 summer jobs both of which are very physical   [place] a lot   Drinks ~ 24 Oz of caffeine in am   3 kids   Married   Lives in [place] [employer]   Never smoker     Family History:  The patient's family history includes CAD (age of onset: 39) in his paternal aunt; Coronary artery disease (age of onset: 60) in his paternal uncle; Hypertension in his father and mother.   ROS:   Please see the history of present illness.    Chest pain and pressure.  All other systems reviewed and are negative.   PHYSICAL EXAM:   VS:  BP (!)  144/96 (BP Location: Right Arm)   Pulse 62   Ht 5\' 11"  (1.803 m)   Wt 174 lb (78.9 kg)   BMI 24.27 kg/m    GEN: Well nourished, well developed, in no acute distress  HEENT: normal  Neck: no JVD, carotid bruits, or masses Cardiac: RRR; no murmurs, rubs, or gallops,no edema  Respiratory:  clear to auscultation bilaterally, normal work of breathing GI: soft, nontender, nondistended, + BS MS: no deformity or atrophy  Skin: warm and dry, no rash Neuro:  Alert and Oriented x 3, Strength and sensation are intact Psych: euthymic mood, full affect  Wt Readings from Last 3 Encounters:  07/08/17 174 lb (78.9 kg)  03/18/17 170 lb (77.1 kg)  12/21/16 162 lb (73.5 kg)      Studies/Labs Reviewed:   EKG:  EKG  Not repeated.  Recent Labs: 12/04/2016: TSH 1.151 12/05/2016: ALT 18 12/06/2016: Hemoglobin 12.1; Platelets 178 12/09/2016: BUN 11; Creatinine, Ser 1.11; Potassium 3.9; Sodium 139   Lipid Panel No results found for: CHOL, TRIG, HDL, CHOLHDL, VLDL, LDLCALC, LDLDIRECT  Additional studies/ records that were reviewed today include:  Myocardial perfusion imaging performed February 2016 demonstrated: No ischemia on NUC.Prior scar noted. EF 37%.   ASSESSMENT:    1. Chronic systolic heart failure (HCC)   2. LBBB (left bundle branch block)   3. AICD (automatic cardioverter/defibrillator) present   4. Angina pectoris (HCC)   5. 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 Chest pain, unspecified type      PLAN:  In order of problems listed above:  1. No evidence of volume overload. 2. Has been chronic 3. No dysfunction or therapy due to arrhythmia. 4. Symptoms are somewhat concerning. Only 2 episodes have occurred in the setting of relatively little physical provocation. I've instructed him to use sublingual nitroglycerin. We will repeat a myocardial perfusion study to rule  out evidence of ischemia/ risk stratify.itroglycerin is recommended if recurrent chest discomfort. 5. Presumed LV systolic dysfunction from poorly controlled high blood pressure.  Clinical follow-up 6 months. Nitroglycerin prescribed.  Medication Adjustments/Labs and Tests Ordered: Current medicines are reviewed at length with the patient today.  Concerns regarding medicines are outlined above.  Medication changes, Labs and Tests ordered today are listed in the Patient Instructions below. Patient Instructions  Medication Instructions:  Your physician recommends that you continue on your current medications as directed. Please refer to the Current Medication list given to you today.  The proper use and anticipated side effects of nitroglycerine has been carefully explained.  If a single episode of chest pain  is not relieved by one tablet, the patient will try another within 5 minutes; and if this doesn't relieve the pain, the patient is instructed to call 911 for transportation to an emergency department.  Labwork: None  Testing/Procedures: Your physician has requested that you have a lexiscan myoview. For further information please visit [email or web address]. Please follow instruction sheet, as given.   Follow-Up: Your physician wants you to follow-up in: 6 months with Dr. Katrinka BlazingSmith. You will receive a reminder letter in the mail two months in advance. If you don't receive a letter, please call our office to schedule the follow-up appointment.   Any Other Special Instructions Will Be Listed Below (If Applicable).     If you need a refill on your cardiac medications before your next appointment, please call your pharmacy.      Signed, Lesleigh NoeHenry W Cael Worth [name], MD  07/08/2017 10:27 AM    [facility] [address], [place], [place]  [address] Phone: [phone]; Fax: [phone]

## 2017-07-12 ENCOUNTER — Telehealth (HOSPITAL_COMMUNITY): Payer: Self-pay | Admitting: *Deleted

## 2017-07-12 NOTE — Telephone Encounter (Signed)
Patient given detailed instructions per Myocardial Perfusion Study Information Sheet for the test on 07/16/17 at 0715. Patient notified to arrive 15 minutes early and that it is imperative to arrive on time for appointment to keep from having the test rescheduled.  If you need to cancel or reschedule your appointment, please call the office within 24 hours of your appointment. . Patient verbalized understanding.Jahaira Earnhart, Adelene Idler

## 2017-07-16 ENCOUNTER — Ambulatory Visit (HOSPITAL_COMMUNITY): Payer: BC Managed Care – PPO | Attending: Interventional Cardiology

## 2017-07-16 DIAGNOSIS — I209 Angina pectoris, unspecified: Secondary | ICD-10-CM | POA: Diagnosis not present

## 2017-07-16 DIAGNOSIS — R079 Chest pain, unspecified: Secondary | ICD-10-CM | POA: Insufficient documentation

## 2017-07-16 DIAGNOSIS — R9439 Abnormal result of other cardiovascular function study: Secondary | ICD-10-CM | POA: Insufficient documentation

## 2017-07-16 LAB — MYOCARDIAL PERFUSION IMAGING
LV dias vol: 133 mL (ref 62–150)
LV sys vol: 83 mL
Peak HR: 88 {beats}/min
RATE: 0.27
Rest HR: 54 {beats}/min
SDS: 0
SRS: 6
SSS: 6
TID: 0.93

## 2017-07-16 MED ORDER — TECHNETIUM TC 99M TETROFOSMIN IV KIT
10.3000 | PACK | Freq: Once | INTRAVENOUS | Status: AC | PRN
  Administered 2017-07-16: 10.3 via INTRAVENOUS
  Filled 2017-07-16: qty 11

## 2017-07-16 MED ORDER — REGADENOSON 0.4 MG/5ML IV SOLN
0.4000 mg | Freq: Once | INTRAVENOUS | Status: AC
  Administered 2017-07-16: 0.4 mg via INTRAVENOUS

## 2017-07-16 MED ORDER — TECHNETIUM TC 99M TETROFOSMIN IV KIT
32.5000 | PACK | Freq: Once | INTRAVENOUS | Status: AC | PRN
  Administered 2017-07-16: 32.5 via INTRAVENOUS
  Filled 2017-07-16: qty 33

## 2017-07-19 ENCOUNTER — Telehealth: Payer: Self-pay | Admitting: Interventional Cardiology

## 2017-07-19 NOTE — Telephone Encounter (Signed)
Returning your call  .. Thanks  °

## 2017-07-19 NOTE — Telephone Encounter (Signed)
Informed pt of stress test results. Pt verbalized understanding. 

## 2018-01-07 ENCOUNTER — Other Ambulatory Visit: Payer: Self-pay | Admitting: *Deleted

## 2018-01-07 MED ORDER — LISINOPRIL 10 MG PO TABS: 10.0000 mg | ORAL_TABLET | Freq: Every day | ORAL | 1 refills | Status: DC

## 2018-01-25 NOTE — Progress Notes (Signed)
CARDIOLOGY OFFICE NOTE  Date:  01/26/2018    Hughie Closs Date of Birth: 09/25/1960 Medical Record #[id number]  PCP:  Georgianne Fick, MD  Cardiologist:  Kyra Manges    Chief Complaint  Patient presents with  . Congestive Heart Failure  . Coronary Artery Disease    History of Present Illness: MONTEL Jeremy Wagner is a 57 y.o. male who presents today for a 6 month check. Seen for Dr. [name].   He has a history of hypertension, nonobstructive coronary disease, long-standing history of LV systolic dysfunction, left bundle branch block, nonischemic cardiomyopathy, syncope 2018, and prior AICD implantation in 2018.  Last seen in October by Dr. [name]. He had had some chest pressure back in September with some mild shortness of breath. Myoview was updated in light of chest pain at a relatively low level of activity.   Comes in today. Here alone. He notes he has done well since last seen by Dr. [name]. He did have a spell about 2 weeks ago - was aerating a football field - lots of "jarring" - that afternoon noted some pain around the ICD - very fleeting - would come and go - lasted a few hours - then stopped. He has not had since. His remote monitoring is not set up. No other chest pain. Breathing is good. Weight is stable. Labs being checked soon by his PCP with his physical. He really has no concerns. Did need his Lisinopril refilled - was out for about a week. Had too much salt for Mother's Day - typically does better with salt restriction.   Past Medical History:  Diagnosis Date  . Anxiety   . Chronic systolic CHF (congestive heart failure) (HCC)   . GERD (gastroesophageal reflux disease)   . Heart murmur   . Hematuria   . Hypertension   . Insomnia   . LBBB (left bundle branch block)   . Nonischemic cardiomyopathy (HCC)    a. LVEF 35-40% by cath 01-13-08. Normal coronary arteries. b. 2D echo 2016: EF 30-35%.    Past Surgical History:  Procedure Laterality Date  . BIV ICD  INSERTION CRT-D N/A 12/09/2016   Procedure: BiV ICD Insertion CRT-D;  Surgeon: Duke Salvia, MD;  Location: [facility] INVASIVE CV LAB;  Service: Cardiovascular;  Laterality: N/A;  . CARDIAC CATHETERIZATION  01/2008   clean Coronaries, HTN CM??; EF ~30-35%     Medications: Current Meds  Medication Sig  . aspirin EC 81 MG tablet Take 1 tablet (81 mg total) by mouth daily.  . carvedilol (COREG) 6.25 MG tablet Take 1 tablet (6.25 mg total) by mouth 2 (two) times daily with a meal.  . lisinopril (PRINIVIL,ZESTRIL) 10 MG tablet Take 1 tablet (10 mg total) by mouth daily.  . nitroGLYCERIN (NITROSTAT) 0.4 MG SL tablet Place 1 tablet (0.4 mg total) under the tongue every 5 (five) minutes as needed for chest pain.  . [DISCONTINUED] lisinopril (PRINIVIL,ZESTRIL) 10 MG tablet Take 1 tablet (10 mg total) by mouth daily.     Allergies: No Known Allergies  Social History: The patient  reports that he has never smoked. He has never used smokeless tobacco. He reports that he does not drink alcohol or use drugs.   Family History: The patient's family history includes CAD (age of onset: 71) in his paternal aunt; Coronary artery disease (age of onset: 56) in his paternal uncle; Hypertension in his father and mother.   Review of Systems: Please see the history of  present illness.   Otherwise, the review of systems is positive for none.   All other systems are reviewed and negative.   Physical Exam: VS:  BP 140/90 (BP Location: Left Arm, Patient Position: Sitting, Cuff Size: Normal)   Pulse 68   Ht 5\' 11"  (1.803 m)   Wt 172 lb 1.9 oz (78.1 kg)   BMI 24.01 kg/m  .  BMI Body mass index is 24.01 kg/m.  Wt Readings from Last 3 Encounters:  01/26/18 172 lb 1.9 oz (78.1 kg)  07/08/17 174 lb (78.9 kg)  03/18/17 170 lb (77.1 kg)   BP is 130/80 by me  General: Pleasant. Well developed, well nourished and in no acute distress.   HEENT: Normal.  Neck: Supple, no JVD, carotid bruits, or masses noted.    Cardiac: Regular rate and rhythm. No murmurs, rubs, or gallops. No edema. ICD in the left upper chest - site looks ok.  Respiratory:  Lungs are clear to auscultation bilaterally with normal work of breathing.  GI: Soft and nontender.  MS: No deformity or atrophy. Gait and ROM intact.  Skin: Warm and dry. Color is normal.  Neuro:  Strength and sensation are intact and no gross focal deficits noted.  Psych: Alert, appropriate and with normal affect.   LABORATORY DATA:  EKG:  EKG is ordered today. This demonstrates A sensing and V pacing.  Lab Results  Component Value Date   WBC 6.3 12/06/2016   HGB 12.1 (L) 12/06/2016   HCT 36.6 (L) 12/06/2016   PLT 178 12/06/2016   GLUCOSE 94 12/09/2016   ALT 18 12/05/2016   AST 20 12/05/2016   NA 139 12/09/2016   K 3.9 12/09/2016   CL 101 12/09/2016   CREATININE 1.11 12/09/2016   BUN 11 12/09/2016   CO2 28 12/09/2016   TSH 1.151 12/04/2016       BNP (last 3 results) No results for input(s): BNP in the last 8760 hours.  ProBNP (last 3 results) No results for input(s): PROBNP in the last 8760 hours.   Other Studies Reviewed Today:  Myoview Study Highlights 07/2017     Nuclear stress EF: 38%.  There was no ST segment deviation noted during stress.  This is an intermediate risk study.  The left ventricular ejection fraction is moderately decreased (30-44%).   1. EF 38%, diffuse hypokinesis. 2. Low counts noted generally at rest and with stress.  3. Fixed small mild apical perfusion defect. Cannot rule out prior infarction.  No ischemia.   Intermediate risk study due to low EF.      Assessment/Plan:  1. Chronic systolic HF - looks good clinically. Reminded of salt restriction. No changes made today.   2. LBBB  3. Underlying ICD - device needs checking today. Will get him to see EP in July   4. HTN - recheck by me is ok. No changes made today.   5. CKD  6. Chest pain - recent Myoview noted - most recent spell  seems more related to the motion of aerating - checking his ICD today - this has not recurred.   Current medicines are reviewed with the patient today.  The patient does not have concerns regarding medicines other than what has been noted above.  The following changes have been made:  See above.  Labs/ tests ordered today include:    Orders Placed This Encounter  Procedures  . EKG 12-Lead     Disposition:   FU with Dr. [name] in 6  months. See Dr. Graciela Husbands in July.    Patient is agreeable to this plan and will call if any problems develop in the interim.   SignedNorma Fredrickson, NP  01/26/2018 4:11 PM  [facility] [address] [place], [place]  [address] Phone: [phone] Fax: [phone]

## 2018-01-26 ENCOUNTER — Ambulatory Visit (INDEPENDENT_AMBULATORY_CARE_PROVIDER_SITE_OTHER): Payer: BC Managed Care – PPO | Admitting: *Deleted

## 2018-01-26 ENCOUNTER — Ambulatory Visit: Payer: BC Managed Care – PPO | Admitting: Nurse Practitioner

## 2018-01-26 ENCOUNTER — Encounter: Payer: Self-pay | Admitting: Nurse Practitioner

## 2018-01-26 ENCOUNTER — Encounter (INDEPENDENT_AMBULATORY_CARE_PROVIDER_SITE_OTHER): Payer: Self-pay

## 2018-01-26 VITALS — BP 140/90 | HR 68 | Ht 71.0 in | Wt 172.1 lb

## 2018-01-26 DIAGNOSIS — Z95810 Presence of automatic (implantable) cardiac defibrillator: Secondary | ICD-10-CM

## 2018-01-26 DIAGNOSIS — I447 Left bundle-branch block, unspecified: Secondary | ICD-10-CM

## 2018-01-26 DIAGNOSIS — I5022 Chronic systolic (congestive) heart failure: Secondary | ICD-10-CM

## 2018-01-26 LAB — CUP PACEART INCLINIC DEVICE CHECK
Battery Remaining Longevity: 89 mo
Battery Voltage: 2.99 V
Brady Statistic AP VP Percent: 0.83 %
Brady Statistic AP VS Percent: 0.01 %
Brady Statistic AS VP Percent: 99.12 %
Brady Statistic AS VS Percent: 0.04 %
Brady Statistic RA Percent Paced: 0.84 %
Brady Statistic RV Percent Paced: 99.57 %
Date Time Interrogation Session: 20190515164153
HighPow Impedance: 70 Ohm
Implantable Lead Implant Date: 20180328
Implantable Lead Implant Date: 20180328
Implantable Lead Implant Date: 20180328
Implantable Lead Location: 753858
Implantable Lead Location: 753859
Implantable Lead Location: 753860
Implantable Lead Model: 5076
Implantable Pulse Generator Implant Date: 20180328
Lead Channel Impedance Value: 223.149
Lead Channel Impedance Value: 228 Ohm
Lead Channel Impedance Value: 235.98 Ohm
Lead Channel Impedance Value: 241.412
Lead Channel Impedance Value: 241.412
Lead Channel Impedance Value: 342 Ohm
Lead Channel Impedance Value: 399 Ohm
Lead Channel Impedance Value: 437 Ohm
Lead Channel Impedance Value: 456 Ohm
Lead Channel Impedance Value: 456 Ohm
Lead Channel Impedance Value: 513 Ohm
Lead Channel Impedance Value: 513 Ohm
Lead Channel Impedance Value: 722 Ohm
Lead Channel Impedance Value: 817 Ohm
Lead Channel Impedance Value: 817 Ohm
Lead Channel Impedance Value: 836 Ohm
Lead Channel Impedance Value: 855 Ohm
Lead Channel Impedance Value: 893 Ohm
Lead Channel Pacing Threshold Amplitude: 0.625 V
Lead Channel Pacing Threshold Amplitude: 0.625 V
Lead Channel Pacing Threshold Amplitude: 1 V
Lead Channel Pacing Threshold Pulse Width: 0.4 ms
Lead Channel Pacing Threshold Pulse Width: 0.4 ms
Lead Channel Pacing Threshold Pulse Width: 0.4 ms
Lead Channel Sensing Intrinsic Amplitude: 14.125 mV
Lead Channel Sensing Intrinsic Amplitude: 17.5 mV
Lead Channel Sensing Intrinsic Amplitude: 2.25 mV
Lead Channel Sensing Intrinsic Amplitude: 2.25 mV
Lead Channel Setting Pacing Amplitude: 1.5 V
Lead Channel Setting Pacing Amplitude: 1.5 V
Lead Channel Setting Pacing Amplitude: 2.5 V
Lead Channel Setting Pacing Pulse Width: 0.4 ms
Lead Channel Setting Pacing Pulse Width: 0.4 ms
Lead Channel Setting Sensing Sensitivity: 0.3 mV

## 2018-01-26 MED ORDER — LISINOPRIL 10 MG PO TABS: 10.0000 mg | ORAL_TABLET | Freq: Every day | ORAL | 3 refills | Status: DC

## 2018-01-26 NOTE — Progress Notes (Signed)
CRT-D device check in Jeremy Wagner's clinic added on d/t loss of follow-up. Thresholds and sensing consistent with previous device measurements. Lead impedance trends stable over time. No mode switch episodes recorded. 3 NSVT episodes- longest 16 beats. 16 SVT episodes, 1:1 @ 171bpm. Patient bi-ventricularly pacing 99.6% of the time. Device programmed with appropriate safety margins. Heart failure diagnostics reviewed, thoracic impedance at baseline. No changes made this session. Estimated longevity 7.3 years.  Patient enrolled in remote follow up. Carelink 04/27/18, ROV with SK 03/21/18.

## 2018-01-26 NOTE — Patient Instructions (Addendum)
We will be checking the following labs today - NONE   Medication Instructions:    Continue with your current medicines.   I did send in more refills for your Lisinopril    Testing/Procedures To Be Arranged:  Device check today  Follow-Up:   See Dr. [name] in 6 months  See Dr. Graciela Husbands in July for ICD check.     Other Special Instructions:   N/A    If you need a refill on your cardiac medications before your next appointment, please call your pharmacy.   Call the [facility] office at [phone] if you have any questions, problems or concerns.

## 2018-03-10 ENCOUNTER — Encounter: Payer: Self-pay | Admitting: Internal Medicine

## 2018-03-16 ENCOUNTER — Encounter: Payer: Self-pay | Admitting: Internal Medicine

## 2018-03-16 ENCOUNTER — Ambulatory Visit: Payer: BC Managed Care – PPO | Admitting: Internal Medicine

## 2018-03-16 VITALS — BP 106/80 | HR 66 | Ht 71.0 in | Wt 172.0 lb

## 2018-03-16 DIAGNOSIS — I447 Left bundle-branch block, unspecified: Secondary | ICD-10-CM | POA: Diagnosis not present

## 2018-03-16 DIAGNOSIS — I5022 Chronic systolic (congestive) heart failure: Secondary | ICD-10-CM | POA: Diagnosis not present

## 2018-03-16 DIAGNOSIS — Z95810 Presence of automatic (implantable) cardiac defibrillator: Secondary | ICD-10-CM | POA: Diagnosis not present

## 2018-03-16 MED ORDER — SACUBITRIL-VALSARTAN 24-26 MG PO TABS: 1.0000 | ORAL_TABLET | Freq: Two times a day (BID) | ORAL | 0 refills | Status: DC

## 2018-03-16 NOTE — Progress Notes (Signed)
      Patient Care Team: Georgianne Fick, MD as PCP - General (Internal Medicine)   HPI  Jeremy Wagner is a 57 y.o. male Seen in follow-up for syncope with left bundle-branch block and nonischemic cardiomyopathy. He underwent ICD implantation 3/18.  No interval syncope  DATE TEST EF   3/18 Echo   20-25  %   11/18 MYOVIEW  38 %           The patient denies chest pain, shortness of breath, nocturnal dyspnea, orthopnea or peripheral edema.  There have been no palpitations, lightheadedness or syncope.    Records and Results Reviewed   Past Medical History:  Diagnosis Date  . Anxiety   . Chronic systolic CHF (congestive heart failure) (HCC)   . GERD (gastroesophageal reflux disease)   . Heart murmur   . Hematuria   . Hypertension   . Insomnia   . LBBB (left bundle branch block)   . Nonischemic cardiomyopathy (HCC)    a. LVEF 35-40% by cath 01-13-08. Normal coronary arteries. b. 2D echo 2016: EF 30-35%.    Past Surgical History:  Procedure Laterality Date  . BIV ICD INSERTION CRT-D N/A 12/09/2016   Procedure: BiV ICD Insertion CRT-D;  Surgeon: Duke Salvia, MD;  Location: [facility] INVASIVE CV LAB;  Service: Cardiovascular;  Laterality: N/A;  . CARDIAC CATHETERIZATION  01/2008   clean Coronaries, HTN CM??; EF ~30-35%    Current Outpatient Medications  Medication Sig Dispense Refill  . aspirin EC 81 MG tablet Take 1 tablet (81 mg total) by mouth daily. 30 tablet 0  . carvedilol (COREG) 6.25 MG tablet Take 1 tablet (6.25 mg total) by mouth 2 (two) times daily with a meal. 180 tablet 3  . lisinopril (PRINIVIL,ZESTRIL) 10 MG tablet Take 1 tablet (10 mg total) by mouth daily. 90 tablet 3  . nitroGLYCERIN (NITROSTAT) 0.4 MG SL tablet Place 1 tablet (0.4 mg total) under the tongue every 5 (five) minutes as needed for chest pain. 25 tablet 5   No current facility-administered medications for this visit.     No Known Allergies    Review of Systems negative except from HPI and  PMH  Physical Exam BP 106/80   Pulse 66   Ht 5\' 11"  (1.803 m)   Wt 172 lb (78 kg)   SpO2 96%   BMI 23.99 kg/m  Well developed and nourished in no acute distress HENT normal Neck supple with JVP-flat Clear Regular rate and rhythm, no murmurs or gallops Abd-soft with active BS No Clubbing cyanosis edema Skin-warm and dry A & Oriented  Grossly normal sensory and motor function   ECG  Sinus with P-synchronous/ AV  pacing QRS upright V1  Assessment and  Plan NICM  LBBB  Syncope  CRT-D  Medtronic The patient's device was interrogated.  The information was reviewed. No changes were made in the programming.     SVT  No intercurrent syncope  Euvolemic continue current meds  With LV dysfunction, will switchfrom lisinopril >> entresto        Current medicines are reviewed at length with the patient today .  The patient does not  have concerns regarding medicines.

## 2018-03-16 NOTE — Patient Instructions (Addendum)
Medication Instructions:  Your physician has recommended you make the following change in your medication:   1. Stop your Lisinopril 2. Begin Entresto, one tablet on Friday Evening. Beginning Sat, you will take one tablet in the morning and one tablet in the evening.   NOTE **Lisinopril and Entresto should not be taken together**  Labwork: Your physician recommends that you return for lab work in: 2 weeks for a BMP   Testing/Procedures: None ordered.  Follow-Up:  Please schedule an Entresto start visit with the pharmacy the week of July 15th for a BMP and blood pressure check.   Your physician wants you to follow-up in: One Year with Francis Dowse.  You will receive a reminder letter in the mail two months in advance. If you don't receive a letter, please call our office to schedule the follow-up appointment.  Remote monitoring is used to monitor your ICD from home. This monitoring reduces the number of office visits required to check your device to one time per year. It allows [place] to keep an eye on the functioning of your device to ensure it is working properly. You are scheduled for a device check from home on 06/15/2018. You may send your transmission at any time that day. If you have a wireless device, the transmission will be sent automatically. After your physician reviews your transmission, you will receive a postcard with your next transmission date.    Any Other Special Instructions Will Be Listed Below (If Applicable).     If you need a refill on your cardiac medications before your next appointment, please call your pharmacy.

## 2018-03-21 ENCOUNTER — Encounter: Payer: BC Managed Care – PPO | Admitting: Internal Medicine

## 2018-03-28 ENCOUNTER — Telehealth: Payer: Self-pay | Admitting: Internal Medicine

## 2018-03-28 NOTE — Telephone Encounter (Signed)
New message    Pt c/o medication issue:  1. Name of Medication:sacubitril-valsartan (ENTRESTO) 24-26 MG  2. How are you currently taking this medication (dosage and times per day)? Take 1 tablet by mouth 2 (two) times daily.  3. Are you having a reaction (difficulty breathing--STAT)? Swelling in legs  4. What is your medication issue? Itching and swelling in legs. Patient stopped med on 03/24/18

## 2018-03-28 NOTE — Telephone Encounter (Signed)
Returned call to Pt.  Advised Pt per pharmacy he should take his BP every day and come to pharmacy visit on Friday 04/01/2018.  Per Pt he does not want pharmacy visit.  He does not want to try any other medications.  Pt states lisinopril has worked fine for him for 10 years and he doesn't want to make any changes.  Per Pt he restarted his lisinopril today after waiting 3 days from last dose of Entresto.  Cancelled pharmacy visit per Pt request.    Removed Entresto from Pt med list.  Pt verbalized he had restarted lisinopril.  Will send to Dr. Graciela Husbands and nurse for f/u.

## 2018-03-31 NOTE — Telephone Encounter (Signed)
Noted  

## 2018-04-01 ENCOUNTER — Ambulatory Visit: Payer: BC Managed Care – PPO

## 2018-06-15 ENCOUNTER — Ambulatory Visit (INDEPENDENT_AMBULATORY_CARE_PROVIDER_SITE_OTHER): Payer: BC Managed Care – PPO | Admitting: *Deleted

## 2018-06-15 DIAGNOSIS — I5022 Chronic systolic (congestive) heart failure: Secondary | ICD-10-CM

## 2018-06-15 DIAGNOSIS — I428 Other cardiomyopathies: Secondary | ICD-10-CM

## 2018-06-15 NOTE — Progress Notes (Signed)
Remote ICD transmission.   

## 2018-06-21 LAB — CUP PACEART REMOTE DEVICE CHECK
Battery Remaining Longevity: 79 mo
Battery Voltage: 2.99 V
Brady Statistic AP VP Percent: 1.02 %
Brady Statistic AP VS Percent: 0.01 %
Brady Statistic AS VP Percent: 98.93 %
Brady Statistic AS VS Percent: 0.04 %
Brady Statistic RA Percent Paced: 1.03 %
Brady Statistic RV Percent Paced: 99.73 %
Date Time Interrogation Session: 20191002073423
HighPow Impedance: 67 Ohm
Implantable Lead Implant Date: 20180328
Implantable Lead Implant Date: 20180328
Implantable Lead Implant Date: 20180328
Implantable Lead Location: 753858
Implantable Lead Location: 753859
Implantable Lead Location: 753860
Implantable Lead Model: 5076
Implantable Pulse Generator Implant Date: 20180328
Lead Channel Impedance Value: 208.568
Lead Channel Impedance Value: 218.5 Ohm
Lead Channel Impedance Value: 234.706
Lead Channel Impedance Value: 247.358
Lead Channel Impedance Value: 247.358
Lead Channel Impedance Value: 323 Ohm
Lead Channel Impedance Value: 342 Ohm
Lead Channel Impedance Value: 399 Ohm
Lead Channel Impedance Value: 437 Ohm
Lead Channel Impedance Value: 437 Ohm
Lead Channel Impedance Value: 513 Ohm
Lead Channel Impedance Value: 570 Ohm
Lead Channel Impedance Value: 665 Ohm
Lead Channel Impedance Value: 722 Ohm
Lead Channel Impedance Value: 760 Ohm
Lead Channel Impedance Value: 855 Ohm
Lead Channel Impedance Value: 855 Ohm
Lead Channel Impedance Value: 893 Ohm
Lead Channel Pacing Threshold Amplitude: 0.625 V
Lead Channel Pacing Threshold Amplitude: 0.75 V
Lead Channel Pacing Threshold Amplitude: 1.125 V
Lead Channel Pacing Threshold Pulse Width: 0.4 ms
Lead Channel Pacing Threshold Pulse Width: 0.4 ms
Lead Channel Pacing Threshold Pulse Width: 0.4 ms
Lead Channel Sensing Intrinsic Amplitude: 13.75 mV
Lead Channel Sensing Intrinsic Amplitude: 13.75 mV
Lead Channel Sensing Intrinsic Amplitude: 2.125 mV
Lead Channel Sensing Intrinsic Amplitude: 2.125 mV
Lead Channel Setting Pacing Amplitude: 1.5 V
Lead Channel Setting Pacing Amplitude: 2.25 V
Lead Channel Setting Pacing Amplitude: 2.5 V
Lead Channel Setting Pacing Pulse Width: 0.4 ms
Lead Channel Setting Pacing Pulse Width: 0.4 ms
Lead Channel Setting Sensing Sensitivity: 0.3 mV

## 2018-08-07 NOTE — Progress Notes (Signed)
Cardiology Office Note:    Date:  08/08/2018   ID:  Jeremy Wagner, DOB April 13, 1961, MRN [id number]  PCP:  Georgianne Fick, MD  Cardiologist:  Lesleigh Noe, MD   Referring MD: Georgianne Fick, MD   Chief Complaint  Patient presents with  . Congestive Heart Failure    History of Present Illness:    Jeremy Wagner is a 57 y.o. male with a hx of hypertension, nonobstructive coronary disease, long-standing history of LV systolic dysfunction, left bundle branch block, nonischemic cardiomyopathy, syncope 2018, and recent CRT-D implantation 11/2016.  He has no cardiac complaints.  He specifically denies orthopnea, PND, dyspnea on exertion, palpitations, syncope, and AICD discharge.  He had some chest tightness that he correlates with taking cyclobenzaprine.  When he does not take the medication has not occurred.  He was awakening from sleep with chest tightness on nights that he would use cyclobenzaprine for muscle spasm and back pain.  Past Medical History:  Diagnosis Date  . Anxiety   . Chronic systolic CHF (congestive heart failure) (HCC)   . GERD (gastroesophageal reflux disease)   . Heart murmur   . Hematuria   . Hypertension   . Insomnia   . LBBB (left bundle branch block)   . Nonischemic cardiomyopathy (HCC)    a. LVEF 35-40% by cath 01-13-08. Normal coronary arteries. b. 2D echo 2016: EF 30-35%.    Past Surgical History:  Procedure Laterality Date  . BIV ICD INSERTION CRT-D N/A 12/09/2016   Procedure: BiV ICD Insertion CRT-D;  Surgeon: Duke Salvia, MD;  Location: [facility] INVASIVE CV LAB;  Service: Cardiovascular;  Laterality: N/A;  . CARDIAC CATHETERIZATION  01/2008   clean Coronaries, HTN CM??; EF ~30-35%    Current Medications: Current Meds  Medication Sig  . aspirin EC 81 MG tablet Take 1 tablet (81 mg total) by mouth daily.  . carvedilol (COREG) 6.25 MG tablet Take 1 tablet (6.25 mg total) by mouth 2 (two) times daily with a meal.  . lisinopril  (PRINIVIL,ZESTRIL) 10 MG tablet Take 10 mg by mouth daily.  . nitroGLYCERIN (NITROSTAT) 0.4 MG SL tablet Place 1 tablet (0.4 mg total) under the tongue every 5 (five) minutes as needed for chest pain.  Marland Kitchen tiZANidine (ZANAFLEX) 4 MG tablet Take 1 tablet by mouth as needed.     Allergies:   Patient has no known allergies.   Social History   Socioeconomic History  . Marital status: Married    Spouse name: Not on file  . Number of children: Not on file  . Years of education: Not on file  . Highest education level: Not on file  Occupational History  . Not on file  Social Needs  . Financial resource strain: Not on file  . Food insecurity:    Worry: Not on file    Inability: Not on file  . Transportation needs:    Medical: Not on file    Non-medical: Not on file  Tobacco Use  . Smoking status: Never Smoker  . Smokeless tobacco: Never Used  Substance and Sexual Activity  . Alcohol use: No    Alcohol/week: 0.0 standard drinks  . Drug use: No  . Sexual activity: Not on file  Lifestyle  . Physical activity:    Days per week: Not on file    Minutes per session: Not on file  . Stress: Not on file  Relationships  . Social connections:    Talks on phone: Not on file  Gets together: Not on file    Attends religious service: Not on file    Active member of club or organization: Not on file    Attends meetings of clubs or organizations: Not on file    Relationship status: Not on file  Other Topics Concern  . Not on file  Social History Narrative   Works for the city   Physically very active job.   Has 2 summer jobs both of which are very physical   [place] a [place]   Drinks ~ 24 Oz of caffeine in am   3 kids   Married   Lives in [place]   Never smoker     Family History: The patient's family history includes CAD (age of onset: 14) in his paternal aunt; Coronary artery disease (age of onset: 74) in his paternal uncle; Hypertension in his father and  mother.  ROS:   Please see the history of present illness.    Significant difficulty with persistent low back discomfort with pain radiating into his right hip.  He has not improved with steroid Dosepak and other measures.  All other systems reviewed and are negative.  EKGs/Labs/Other Studies Reviewed:    The following studies were reviewed today:  Nuclear Stress Myocardial Perfusion Imaging 07/2017: Study Highlights     Nuclear stress EF: 38%.  There was no ST segment deviation noted during stress.  This is an intermediate risk study.  The left ventricular ejection fraction is moderately decreased (30-44%).   1. EF 38%, diffuse hypokinesis. 2. Low counts noted generally at rest and with stress.  3. Fixed small mild apical perfusion defect. Cannot rule out prior infarction.  No ischemia.   Intermediate risk study due to low EF.      EKG:  EKG is not ordered today.    Recent Labs: No results found for requested labs within last 8760 hours.  Recent Lipid Panel No results found for: CHOL, TRIG, HDL, CHOLHDL, VLDL, LDLCALC, LDLDIRECT  Physical Exam:    VS:  BP 118/88   Pulse 83   Ht 5\' 11"  (1.803 m)   Wt 174 lb 6.4 oz (79.1 kg)   SpO2 97%   BMI 24.32 kg/m     Wt Readings from Last 3 Encounters:  08/08/18 174 lb 6.4 oz (79.1 kg)  03/16/18 172 lb (78 kg)  01/26/18 172 lb 1.9 oz (78.1 kg)     GEN: Appears somewhat sad.  Responds that his back is very uncomfortable today.  Otherwise, well nourished, well developed in no acute distress HEENT: Normal NECK: No JVD. LYMPHATICS: No lymphadenopathy CARDIAC: RRR, no  murmur, no gallop, no edema. VASCULAR: 2+ bilateral radial pulses.  No bruits. RESPIRATORY:  Clear to auscultation without rales, wheezing or rhonchi  ABDOMEN: Soft, non-tender, non-distended, No pulsatile mass, MUSCULOSKELETAL: No deformity  SKIN: Warm and dry NEUROLOGIC:  Alert and oriented x 3 PSYCHIATRIC:  Normal affect   ASSESSMENT:    1.  Chronic systolic heart failure (HCC)   2. AICD (automatic cardioverter/defibrillator) present   3. LBBB (left bundle branch block)   4. Nonischemic cardiomyopathy (HCC)    PLAN:    In order of problems listed above:  1. Clinically without evidence of heart failure signs or symptoms.  Current management of heart failure includes defibrillator therapy/CRT device by Dr. Graciela Husbands.  He is on low-dose beta-blocker and ACE inhibitor therapy.  Blood pressures are soft.  May need to graduate to [facility] therapy but  cost is been an issue.  Currently functional class II. 2. Most recent check was unremarkable 3. CRT use in the setting of significant LV systolic dysfunction.  Low-salt diet, 150 minutes of moderate aerobic activity as tolerated, and clinical follow-up in 6 months.  We will try to stagger office appointments with PCP so that he is seen a physician twice per year.  Will need to have LVEF reevaluated at some point in 2020.   Medication Adjustments/Labs and Tests Ordered: Current medicines are reviewed at length with the patient today.  Concerns regarding medicines are outlined above.  No orders of the defined types were placed in this encounter.  No orders of the defined types were placed in this encounter.   Patient Instructions  Medication Instructions:  Your physician recommends that you continue on your current medications as directed. Please refer to the Current Medication list given to you today.  If you need a refill on your cardiac medications before your next appointment, please call your pharmacy.   Lab work: None ordered If you have labs (blood work) drawn today and your tests are completely normal, you will receive your results only by: Marland Kitchen MyChart Message (if you have MyChart) OR . A paper copy in the mail If you have any lab test that is abnormal or we need to change your treatment, we will call you to review the results.  Testing/Procedures: None  ordered  Follow-Up: At [facility], you and your health needs are our priority.  As part of our continuing mission to provide you with exceptional heart care, we have created designated Provider Care Teams.  These Care Teams include your primary Cardiologist (physician) and Advanced Practice Providers (APPs -  Physician Assistants and Nurse Practitioners) who all work together to provide you with the care you need, when you need it. . You will need a follow up appointment in 1 year.  Please call our office 2 months in advance to schedule this appointment.  You may see Lesleigh Noe, MD or one of the following Advanced Practice Providers on your designated Care Team:   . Norma Fredrickson, NP . Nada Boozer, NP . Georgie Chard, NP   Any Other Special Instructions Will Be Listed Below (If Applicable).     Signed, Lesleigh Noe, MD  08/08/2018 1:32 PM    [facility]

## 2018-08-08 ENCOUNTER — Ambulatory Visit: Payer: BC Managed Care – PPO | Admitting: Interventional Cardiology

## 2018-08-08 ENCOUNTER — Encounter: Payer: Self-pay | Admitting: Interventional Cardiology

## 2018-08-08 VITALS — BP 118/88 | HR 83 | Ht 71.0 in | Wt 174.4 lb

## 2018-08-08 DIAGNOSIS — Z95810 Presence of automatic (implantable) cardiac defibrillator: Secondary | ICD-10-CM

## 2018-08-08 DIAGNOSIS — I447 Left bundle-branch block, unspecified: Secondary | ICD-10-CM

## 2018-08-08 DIAGNOSIS — I428 Other cardiomyopathies: Secondary | ICD-10-CM

## 2018-08-08 DIAGNOSIS — I5022 Chronic systolic (congestive) heart failure: Secondary | ICD-10-CM

## 2018-08-08 NOTE — Patient Instructions (Signed)
Medication Instructions:  Your physician recommends that you continue on your current medications as directed. Please refer to the Current Medication list given to you today.  If you need a refill on your cardiac medications before your next appointment, please call your pharmacy.   Lab work: None ordered If you have labs (blood work) drawn today and your tests are completely normal, you will receive your results only by: . MyChart Message (if you have MyChart) OR . A paper copy in the mail If you have any lab test that is abnormal or we need to change your treatment, we will call you to review the results.  Testing/Procedures: None ordered  Follow-Up: At [facility], you and your health needs are our priority.  As part of our continuing mission to provide you with exceptional heart care, we have created designated Provider Care Teams.  These Care Teams include your primary Cardiologist (physician) and Advanced Practice Providers (APPs -  Physician Assistants and Nurse Practitioners) who all work together to provide you with the care you need, when you need it. . You will need a follow up appointment in 1 year.  Please call our office 2 months in advance to schedule this appointment.  You may see Henry W Smith [name], MD or one of the following Advanced Practice Providers on your designated Care Team:   . Lori Gerhardt, NP . Laura Ingold, NP . Jill McDaniel, NP   Any Other Special Instructions Will Be Listed Below (If Applicable).  

## 2018-08-14 ENCOUNTER — Encounter (HOSPITAL_COMMUNITY): Payer: Self-pay

## 2018-08-14 ENCOUNTER — Emergency Department (HOSPITAL_COMMUNITY): Payer: BC Managed Care – PPO

## 2018-08-14 ENCOUNTER — Emergency Department (HOSPITAL_COMMUNITY)
Admission: EM | Admit: 2018-08-14 | Discharge: 2018-08-14 | Disposition: A | Discharge status: Home / Self Care | Payer: BC Managed Care – PPO | Attending: Emergency Medicine | Admitting: Emergency Medicine

## 2018-08-14 DIAGNOSIS — I130 Hypertensive heart and chronic kidney disease with heart failure and stage 1 through stage 4 chronic kidney disease, or unspecified chronic kidney disease: Secondary | ICD-10-CM | POA: Diagnosis not present

## 2018-08-14 DIAGNOSIS — N184 Chronic kidney disease, stage 4 (severe): Secondary | ICD-10-CM | POA: Insufficient documentation

## 2018-08-14 DIAGNOSIS — Z79899 Other long term (current) drug therapy: Secondary | ICD-10-CM | POA: Insufficient documentation

## 2018-08-14 DIAGNOSIS — I5022 Chronic systolic (congestive) heart failure: Secondary | ICD-10-CM | POA: Insufficient documentation

## 2018-08-14 DIAGNOSIS — M545 Low back pain: Secondary | ICD-10-CM | POA: Diagnosis present

## 2018-08-14 DIAGNOSIS — M5442 Lumbago with sciatica, left side: Secondary | ICD-10-CM | POA: Insufficient documentation

## 2018-08-14 DIAGNOSIS — Z7982 Long term (current) use of aspirin: Secondary | ICD-10-CM | POA: Insufficient documentation

## 2018-08-14 MED ORDER — HYDROCODONE-ACETAMINOPHEN 5-325 MG PO TABS
2.0000 | ORAL_TABLET | Freq: Once | ORAL | Status: AC
  Administered 2018-08-14: 2 via ORAL
  Filled 2018-08-14: qty 2

## 2018-08-14 MED ORDER — PREDNISONE 20 MG PO TABS: 40.0000 mg | ORAL_TABLET | Freq: Every day | ORAL | 0 refills | Status: AC

## 2018-08-14 MED ORDER — DIAZEPAM 5 MG/ML IJ SOLN
5.0000 mg | Freq: Once | INTRAMUSCULAR | Status: AC
  Administered 2018-08-14: 5 mg via INTRAMUSCULAR
  Filled 2018-08-14: qty 2

## 2018-08-14 MED ORDER — LIDOCAINE 5 % EX PTCH: 1.0000 | MEDICATED_PATCH | CUTANEOUS | 0 refills | Status: DC

## 2018-08-14 MED ORDER — CYCLOBENZAPRINE HCL 10 MG PO TABS: 10.0000 mg | ORAL_TABLET | Freq: Two times a day (BID) | ORAL | 0 refills | Status: DC | PRN

## 2018-08-14 NOTE — ED Triage Notes (Addendum)
Patient c/o of lower back pain that radiates to left hip X1 month. Patient states "he was walking and it just started hurting". Patient states pain has gotten worse this past week. Patient tried taking tylenol with no relief. Patient saw primary care doctor 2 weeks ago. who prescribed 600 mg ibuprofen and muscle relaxer. Patient had some relief.  10/10 sharp lower back pain near left hip.  A/Ox4 Ambulatory in triage.

## 2018-08-14 NOTE — Discharge Instructions (Signed)
Take prednisone, Flexeril as directed.  Use the lidocaine patches as needed.  Apply them directly to where you are having the most pain.  You can take Tylenol or Ibuprofen as directed for pain. You can alternate Tylenol and Ibuprofen every 4 hours. If you take Tylenol at 1pm, then you can take Ibuprofen at 5pm. Then you can take Tylenol again at 9pm.   You can use warm compresses and stretches to continue helping to stretch out those muscles.  Follow-up with your primary care doctor in the next 3 to 4 days.  If you are still having pain that persists after 4 weeks, you can follow-up with referred neurosurgery for further evaluation.  Return to the Emergency Department immediately for any worsening back pain, neck pain, difficulty walking, numbness/weaknss of your arms or legs, urinary or bowel accidents, fever or any other worsening or concerning symptoms.

## 2018-08-14 NOTE — ED Provider Notes (Signed)
[facility] Note   CSN: [id number] Arrival date & time: 08/14/18  1116     History   Chief Complaint Chief Complaint  Patient presents with  . Back Pain    HPI Jeremy Wagner is a 57 y.o. male past medical history of anxiety, GERD, hypertension, left bundle branch block, nonischemic cardiomyopathy who presents for evaluation of left lower back pain that radiates into the left hip and posterior left leg that began 1 month ago.  He states that he had no preceding trauma, injury, fall.  He states that it just started hurting.  He was seen by his primary care doctor and onset of symptoms to diagnosed him with sciatica and he was prescribed prednisone, Motrin, Robaxin for pain relief.  He states that the medications initially helped the pain and had improvement in symptoms.  He reports that about a week later, the pain returned.  He called his primary care doctor who extended his dose of prednisone.  He states that the medications will help within the pain always returns.  He states that over the last few days, the pain has returned.  He has been able to walk but does report that the pain is worsened with ambulating.  He states that the pain is also worse with bending and moving.  He states it is a sharp shooting pain that comes from his left lower back and radiates down the back of his buttock and into his leg.  He states that the muscle relaxers will help for a while and then the pain always returns.  He has not had any new trauma, injury, fall. Denies fevers, weight loss, numbness/weakness of upper and lower extremities, bowel/bladder incontinence, saddle anesthesia, history of back surgery, history of IVDA.  Patient denies any chest pain, difficulty breathing, dysuria, hematuria.  The history is provided by the patient.    Past Medical History:  Diagnosis Date  . Anxiety   . Chronic systolic CHF (congestive heart failure) (HCC)   . GERD  (gastroesophageal reflux disease)   . Heart murmur   . Hematuria   . Hypertension   . Insomnia   . LBBB (left bundle branch block)   . Nonischemic cardiomyopathy (HCC)    a. LVEF 35-40% by cath 01-13-08. Normal coronary arteries. b. 2D echo 2016: EF 30-35%.    Patient Active Problem List   Diagnosis Date Noted  . 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 Syncope and collapse 12/04/2016  . Nonischemic cardiomyopathy (HCC)   . Chronic systolic heart failure (HCC)   . Hematuria   . Anxiety   . Insomnia   . Reflux   . 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 LBBB (left bundle branch block) 04/16/2014    Past Surgical History:  Procedure Laterality Date  . BIV ICD INSERTION CRT-D N/A 12/09/2016   Procedure: BiV ICD Insertion CRT-D;  Surgeon: Duke Salvia, MD;  Location: [facility] INVASIVE CV LAB;  Service: Cardiovascular;  Laterality: N/A;  . CARDIAC CATHETERIZATION  01/2008   clean Coronaries, HTN CM??; EF ~30-35%        Home Medications    Prior to Admission medications   Medication Sig Start Date End Date Taking? Authorizing Provider  aspirin EC 81 MG tablet Take 1 tablet (81 mg total) by mouth daily. 10/10/14   Hongalgi, Maximino Greenland, MD  carvedilol (COREG)  6.25 MG tablet Take 1 tablet (6.25 mg total) by mouth 2 (two) times daily with a meal. 03/18/17   Duke Salvia, MD  cyclobenzaprine (FLEXERIL) 10 MG tablet Take 1 tablet (10 mg total) by mouth 2 (two) times daily as needed for muscle spasms. 08/14/18   Graciella Freer A, PA-C  lidocaine (LIDODERM) 5 % Place 1 patch onto the skin daily. Remove & Discard patch within 12 hours or as directed by MD 08/14/18   Maxwell Caul, PA-C  lisinopril (PRINIVIL,ZESTRIL) 10 MG tablet Take 10 mg by mouth daily. 07/11/18   [provider]  nitroGLYCERIN (NITROSTAT) 0.4 MG SL tablet Place 1 tablet  (0.4 mg total) under the tongue every 5 (five) minutes as needed for chest pain. 07/08/17   Lyn Records, MD  predniSONE (DELTASONE) 20 MG tablet Take 2 tablets (40 mg total) by mouth daily for 4 days. 08/14/18 08/18/18  Maxwell Caul, PA-C  tiZANidine (ZANAFLEX) 4 MG tablet Take 1 tablet by mouth as needed. 07/25/18   [provider]    Family History Family History  Problem Relation Age of Onset  . Hypertension Mother   . Hypertension Father   . CAD Paternal Aunt 29  . Coronary artery disease Paternal Uncle 26    Social History Social History   Tobacco Use  . Smoking status: Never Smoker  . Smokeless tobacco: Never Used  Substance Use Topics  . Alcohol use: No    Alcohol/week: 0.0 standard drinks  . Drug use: No     Allergies   Patient has no known allergies.   Review of Systems Review of Systems  Constitutional: Negative for fever.  Respiratory: Negative for shortness of breath.   Cardiovascular: Negative for chest pain.  Genitourinary: Negative for dysuria and hematuria.  Musculoskeletal: Positive for back pain. Negative for neck pain.  Neurological: Negative for weakness and numbness.  All other systems reviewed and are negative.    Physical Exam Updated Vital Signs BP (!) 127/94 (BP Location: Right Arm)   Pulse 89   Temp 98.5 F (36.9 C) (Oral)   Resp 16   SpO2 97%   Physical Exam  Constitutional: He appears well-developed and well-nourished.  HENT:  Head: Normocephalic and atraumatic.  Eyes: Conjunctivae and EOM are normal. Right eye exhibits no discharge. Left eye exhibits no discharge. No scleral icterus.  Neck: Full passive range of motion without pain.  Full flexion/extension and lateral movement of neck fully intact. No bony midline tenderness. No deformities or crepitus.   Cardiovascular:  Pulses:      Radial pulses are 2+ on the right side, and 2+ on the left side.       Dorsalis pedis pulses are 2+ on the right side, and 2+ on  the left side.  Pulmonary/Chest: Effort normal.  Musculoskeletal:       Thoracic back: He exhibits no tenderness.       Back:  No midline T-spine tenderness.  No deformity or crepitus noted.  Tenderness noted to the left paraspinal muscles of the lumbar region that extend into the midline.  No deformity or crepitus noted.  Tenderness also extends down into the gluteal area.  No bony tenderness noted to the left hip.  No overlying warmth, erythema.  Neurological: He is alert.  Follows commands, Moves all extremities  5/5 strength to BUE and BLE  Sensation intact throughout all major nerve distributions Positive SLR on left   Skin: Skin is warm and dry.  Psychiatric: He has a normal mood and affect. His speech is normal and behavior is normal.  Nursing note and vitals reviewed.    ED Treatments / Results  Labs (all labs ordered are listed, but only abnormal results are displayed) Labs Reviewed - No data to display  EKG None  Radiology Dg Lumbar Spine Complete  Result Date: 08/14/2018 CLINICAL DATA:  Low back pain radiating into the left hip for 1 month. EXAM: LUMBAR SPINE - COMPLETE 4+ VIEW COMPARISON:  None. FINDINGS: There is no fracture or malalignment. Marked loss of disc space height and anterior endplate spurring are seen at L5-S1. Intervertebral disc space height is otherwise maintained. Paraspinous structures are unremarkable. IMPRESSION: No acute abnormality. Degenerative disc disease L5-S1. Electronically Signed   By: Drusilla Kanner M.D.   On: 08/14/2018 13:25    Procedures Procedures (including critical care time)  Medications Ordered in ED Medications  HYDROcodone-acetaminophen (NORCO/VICODIN) 5-325 MG per tablet 2 tablet (2 tablets Oral Given 08/14/18 1242)  diazepam (VALIUM) injection 5 mg (5 mg Intramuscular Given 08/14/18 1242)     Initial Impression / Assessment and Plan / ED Course  I have reviewed the triage vital signs and the nursing notes.  Pertinent  labs & imaging results that were available during my care of the patient were reviewed by me and considered in my medical decision making (see chart for details).     57 year old male who presents for evaluation of back pain that is been intermittently ongoing for last month.  No preceding trauma, injury, fall.  Seen by his PCP and diagnosed with sciatica.  He reports that he takes medications for a while and has improvement in pain but then it returns.  Most recent episode started a few days ago.  No new trauma, injury, fall.  No red flags noted in history.  No neuro deficits noted on exam.  He does have positive straight leg raise.  Consider mechanical back pain versus muscle strain versus sciatica. History/Physical exam is not concerning for cauda equina, spinal abscess.  Patient with no urinary, abdominal complaints.  Do not suspect kidney stone.  History/physical exam is not concerning for dissection, ACS etiology.  Given that this pain is been ongoing for several weeks and he has had no prior imaging.  Will obtain x-ray.  Analgesics provided in ED.  X-ray reviewed.  No fracture or malalignment.  He has noted loss of disc space with spurring at L5-S1.  Otherwise unremarkable.   Discussed results with patient.  He has not been on any prednisone for the last week.  Will give a short burst to help with symptomatic relief.  Plan for course of muscle relaxers, Lidoderm patches.  Instructed patient to follow-up with his primary care doctor in the next 3 to 4 days.  Will also give outpatient neurosurgery referral if his pain does not improve in the next 4 weeks, he needs may need further evaluation for them for pain management. At this time, patient exhibits no emergent life-threatening condition that require further evaluation in ED. Patient had ample opportunity for questions and discussion. All patient's questions were answered with full understanding. Strict return precautions discussed. Patient expresses  understanding and agreement to plan.   Portions of this note were generated with Scientist, clinical (histocompatibility and immunogenetics). Dictation errors may occur despite best attempts at proofreading.   Final Clinical Impressions(s) / ED Diagnoses   Final diagnoses:  Acute left-sided low back pain with left-sided sciatica    ED Discharge Orders  Ordered    lidocaine (LIDODERM) 5 %  Every 24 hours     08/14/18 1341    predniSONE (DELTASONE) 20 MG tablet  Daily     08/14/18 1341    cyclobenzaprine (FLEXERIL) 10 MG tablet  2 times daily PRN     08/14/18 1341           Maxwell Caul, PA-C 08/14/18 1408    Maia Plan, MD 08/15/18 [phone]

## 2018-08-14 NOTE — ED Notes (Signed)
Bed: WTR7 Expected date:  Expected time:  Means of arrival:  Comments: 

## 2018-08-31 ENCOUNTER — Other Ambulatory Visit: Payer: Self-pay | Admitting: Nurse Practitioner

## 2018-09-15 ENCOUNTER — Ambulatory Visit (INDEPENDENT_AMBULATORY_CARE_PROVIDER_SITE_OTHER): Payer: BC Managed Care – PPO

## 2018-09-15 DIAGNOSIS — I428 Other cardiomyopathies: Secondary | ICD-10-CM

## 2018-09-15 LAB — CUP PACEART REMOTE DEVICE CHECK
Battery Remaining Longevity: 79 mo
Battery Voltage: 2.99 V
Brady Statistic AP VP Percent: 0.23 %
Brady Statistic AP VS Percent: 0.01 %
Brady Statistic AS VP Percent: 99.72 %
Brady Statistic AS VS Percent: 0.04 %
Brady Statistic RA Percent Paced: 0.24 %
Brady Statistic RV Percent Paced: 99.56 %
Date Time Interrogation Session: 20200102072722
HighPow Impedance: 64 Ohm
Implantable Lead Implant Date: 20180328
Implantable Lead Implant Date: 20180328
Implantable Lead Implant Date: 20180328
Implantable Lead Location: 753858
Implantable Lead Location: 753859
Implantable Lead Location: 753860
Implantable Lead Model: 5076
Implantable Pulse Generator Implant Date: 20180328
Lead Channel Impedance Value: 199.5 Ohm
Lead Channel Impedance Value: 199.5 Ohm
Lead Channel Impedance Value: 228 Ohm
Lead Channel Impedance Value: 228 Ohm
Lead Channel Impedance Value: 228 Ohm
Lead Channel Impedance Value: 323 Ohm
Lead Channel Impedance Value: 380 Ohm
Lead Channel Impedance Value: 399 Ohm
Lead Channel Impedance Value: 399 Ohm
Lead Channel Impedance Value: 399 Ohm
Lead Channel Impedance Value: 437 Ohm
Lead Channel Impedance Value: 532 Ohm
Lead Channel Impedance Value: 646 Ohm
Lead Channel Impedance Value: 665 Ohm
Lead Channel Impedance Value: 703 Ohm
Lead Channel Impedance Value: 817 Ohm
Lead Channel Impedance Value: 836 Ohm
Lead Channel Impedance Value: 836 Ohm
Lead Channel Pacing Threshold Amplitude: 0.5 V
Lead Channel Pacing Threshold Amplitude: 0.75 V
Lead Channel Pacing Threshold Amplitude: 1.125 V
Lead Channel Pacing Threshold Pulse Width: 0.4 ms
Lead Channel Pacing Threshold Pulse Width: 0.4 ms
Lead Channel Pacing Threshold Pulse Width: 0.4 ms
Lead Channel Sensing Intrinsic Amplitude: 16.75 mV
Lead Channel Sensing Intrinsic Amplitude: 16.75 mV
Lead Channel Sensing Intrinsic Amplitude: 2.375 mV
Lead Channel Sensing Intrinsic Amplitude: 2.375 mV
Lead Channel Setting Pacing Amplitude: 1.5 V
Lead Channel Setting Pacing Amplitude: 1.75 V
Lead Channel Setting Pacing Amplitude: 2.5 V
Lead Channel Setting Pacing Pulse Width: 0.4 ms
Lead Channel Setting Pacing Pulse Width: 0.4 ms
Lead Channel Setting Sensing Sensitivity: 0.3 mV

## 2018-09-15 NOTE — Progress Notes (Signed)
Remote ICD transmission.   

## 2018-12-15 ENCOUNTER — Ambulatory Visit (INDEPENDENT_AMBULATORY_CARE_PROVIDER_SITE_OTHER): Payer: BC Managed Care – PPO | Admitting: *Deleted

## 2018-12-15 ENCOUNTER — Other Ambulatory Visit: Payer: Self-pay

## 2018-12-15 DIAGNOSIS — I428 Other cardiomyopathies: Secondary | ICD-10-CM | POA: Diagnosis not present

## 2018-12-15 LAB — CUP PACEART REMOTE DEVICE CHECK
Battery Remaining Longevity: 71 mo
Battery Voltage: 2.98 V
Brady Statistic AP VP Percent: 0.47 %
Brady Statistic AP VS Percent: 0.01 %
Brady Statistic AS VP Percent: 99.48 %
Brady Statistic AS VS Percent: 0.04 %
Brady Statistic RA Percent Paced: 0.48 %
Brady Statistic RV Percent Paced: 99.74 %
Date Time Interrogation Session: 20200402084225
HighPow Impedance: 65 Ohm
Implantable Lead Implant Date: 20180328
Implantable Lead Implant Date: 20180328
Implantable Lead Implant Date: 20180328
Implantable Lead Location: 753858
Implantable Lead Location: 753859
Implantable Lead Location: 753860
Implantable Lead Model: 5076
Implantable Pulse Generator Implant Date: 20180328
Lead Channel Impedance Value: 199.5 Ohm
Lead Channel Impedance Value: 199.5 Ohm
Lead Channel Impedance Value: 228 Ohm
Lead Channel Impedance Value: 228 Ohm
Lead Channel Impedance Value: 228 Ohm
Lead Channel Impedance Value: 323 Ohm
Lead Channel Impedance Value: 342 Ohm
Lead Channel Impedance Value: 399 Ohm
Lead Channel Impedance Value: 399 Ohm
Lead Channel Impedance Value: 399 Ohm
Lead Channel Impedance Value: 513 Ohm
Lead Channel Impedance Value: 532 Ohm
Lead Channel Impedance Value: 646 Ohm
Lead Channel Impedance Value: 665 Ohm
Lead Channel Impedance Value: 703 Ohm
Lead Channel Impedance Value: 779 Ohm
Lead Channel Impedance Value: 817 Ohm
Lead Channel Impedance Value: 817 Ohm
Lead Channel Pacing Threshold Amplitude: 0.625 V
Lead Channel Pacing Threshold Amplitude: 0.75 V
Lead Channel Pacing Threshold Amplitude: 1.25 V
Lead Channel Pacing Threshold Pulse Width: 0.4 ms
Lead Channel Pacing Threshold Pulse Width: 0.4 ms
Lead Channel Pacing Threshold Pulse Width: 0.4 ms
Lead Channel Sensing Intrinsic Amplitude: 12.125 mV
Lead Channel Sensing Intrinsic Amplitude: 2 mV
Lead Channel Setting Pacing Amplitude: 1.5 V
Lead Channel Setting Pacing Amplitude: 2.25 V
Lead Channel Setting Pacing Amplitude: 2.5 V
Lead Channel Setting Pacing Pulse Width: 0.4 ms
Lead Channel Setting Pacing Pulse Width: 0.4 ms
Lead Channel Setting Sensing Sensitivity: 0.3 mV

## 2018-12-23 ENCOUNTER — Encounter: Payer: Self-pay | Admitting: Cardiology

## 2018-12-23 NOTE — Progress Notes (Signed)
Remote ICD transmission.   

## 2019-03-16 ENCOUNTER — Ambulatory Visit (INDEPENDENT_AMBULATORY_CARE_PROVIDER_SITE_OTHER): Payer: BC Managed Care – PPO | Admitting: *Deleted

## 2019-03-16 DIAGNOSIS — I428 Other cardiomyopathies: Secondary | ICD-10-CM | POA: Diagnosis not present

## 2019-03-16 LAB — CUP PACEART REMOTE DEVICE CHECK
Battery Remaining Longevity: 69 mo
Battery Voltage: 2.98 V
Brady Statistic AP VP Percent: 0.9 %
Brady Statistic AP VS Percent: 0.01 %
Brady Statistic AS VP Percent: 99.05 %
Brady Statistic AS VS Percent: 0.04 %
Brady Statistic RA Percent Paced: 0.91 %
Brady Statistic RV Percent Paced: 99.49 %
Date Time Interrogation Session: 20200702084225
HighPow Impedance: 63 Ohm
Implantable Lead Implant Date: 20180328
Implantable Lead Implant Date: 20180328
Implantable Lead Implant Date: 20180328
Implantable Lead Location: 753858
Implantable Lead Location: 753859
Implantable Lead Location: 753860
Implantable Lead Model: 5076
Implantable Pulse Generator Implant Date: 20180328
Lead Channel Impedance Value: 194.634
Lead Channel Impedance Value: 199.5 Ohm
Lead Channel Impedance Value: 214.783
Lead Channel Impedance Value: 220.723
Lead Channel Impedance Value: 220.723
Lead Channel Impedance Value: 323 Ohm
Lead Channel Impedance Value: 380 Ohm
Lead Channel Impedance Value: 380 Ohm
Lead Channel Impedance Value: 380 Ohm
Lead Channel Impedance Value: 399 Ohm
Lead Channel Impedance Value: 399 Ohm
Lead Channel Impedance Value: 494 Ohm
Lead Channel Impedance Value: 646 Ohm
Lead Channel Impedance Value: 665 Ohm
Lead Channel Impedance Value: 665 Ohm
Lead Channel Impedance Value: 779 Ohm
Lead Channel Impedance Value: 817 Ohm
Lead Channel Impedance Value: 817 Ohm
Lead Channel Pacing Threshold Amplitude: 0.375 V
Lead Channel Pacing Threshold Amplitude: 0.75 V
Lead Channel Pacing Threshold Amplitude: 1.375 V
Lead Channel Pacing Threshold Pulse Width: 0.4 ms
Lead Channel Pacing Threshold Pulse Width: 0.4 ms
Lead Channel Pacing Threshold Pulse Width: 0.4 ms
Lead Channel Sensing Intrinsic Amplitude: 1.875 mV
Lead Channel Sensing Intrinsic Amplitude: 14.25 mV
Lead Channel Setting Pacing Amplitude: 1.5 V
Lead Channel Setting Pacing Amplitude: 2 V
Lead Channel Setting Pacing Amplitude: 2.5 V
Lead Channel Setting Pacing Pulse Width: 0.4 ms
Lead Channel Setting Pacing Pulse Width: 0.4 ms
Lead Channel Setting Sensing Sensitivity: 0.3 mV

## 2019-03-23 ENCOUNTER — Encounter: Payer: Self-pay | Admitting: Cardiology

## 2019-03-23 NOTE — Progress Notes (Signed)
Remote ICD transmission.   

## 2019-04-26 ENCOUNTER — Other Ambulatory Visit: Payer: Self-pay | Admitting: Nurse Practitioner

## 2019-05-08 ENCOUNTER — Other Ambulatory Visit: Payer: Self-pay | Admitting: Nurse Practitioner

## 2019-06-15 ENCOUNTER — Ambulatory Visit (INDEPENDENT_AMBULATORY_CARE_PROVIDER_SITE_OTHER): Payer: BC Managed Care – PPO | Admitting: *Deleted

## 2019-06-15 DIAGNOSIS — I428 Other cardiomyopathies: Secondary | ICD-10-CM | POA: Diagnosis not present

## 2019-06-15 LAB — CUP PACEART REMOTE DEVICE CHECK
Battery Remaining Longevity: 64 mo
Battery Voltage: 2.98 V
Brady Statistic AP VP Percent: 0.69 %
Brady Statistic AP VS Percent: 0.01 %
Brady Statistic AS VP Percent: 99.26 %
Brady Statistic AS VS Percent: 0.04 %
Brady Statistic RA Percent Paced: 0.7 %
Brady Statistic RV Percent Paced: 99.59 %
Date Time Interrogation Session: 20201001073524
HighPow Impedance: 64 Ohm
Implantable Lead Implant Date: 20180328
Implantable Lead Implant Date: 20180328
Implantable Lead Implant Date: 20180328
Implantable Lead Location: 753858
Implantable Lead Location: 753859
Implantable Lead Location: 753860
Implantable Lead Model: 5076
Implantable Pulse Generator Implant Date: 20180328
Lead Channel Impedance Value: 190 Ohm
Lead Channel Impedance Value: 190 Ohm
Lead Channel Impedance Value: 221.667
Lead Channel Impedance Value: 221.667
Lead Channel Impedance Value: 221.667
Lead Channel Impedance Value: 323 Ohm
Lead Channel Impedance Value: 380 Ohm
Lead Channel Impedance Value: 380 Ohm
Lead Channel Impedance Value: 380 Ohm
Lead Channel Impedance Value: 380 Ohm
Lead Channel Impedance Value: 380 Ohm
Lead Channel Impedance Value: 532 Ohm
Lead Channel Impedance Value: 589 Ohm
Lead Channel Impedance Value: 646 Ohm
Lead Channel Impedance Value: 665 Ohm
Lead Channel Impedance Value: 760 Ohm
Lead Channel Impedance Value: 779 Ohm
Lead Channel Impedance Value: 817 Ohm
Lead Channel Pacing Threshold Amplitude: 0.375 V
Lead Channel Pacing Threshold Amplitude: 0.75 V
Lead Channel Pacing Threshold Amplitude: 1.25 V
Lead Channel Pacing Threshold Pulse Width: 0.4 ms
Lead Channel Pacing Threshold Pulse Width: 0.4 ms
Lead Channel Pacing Threshold Pulse Width: 0.4 ms
Lead Channel Sensing Intrinsic Amplitude: 13 mV
Lead Channel Sensing Intrinsic Amplitude: 2.125 mV
Lead Channel Setting Pacing Amplitude: 1.5 V
Lead Channel Setting Pacing Amplitude: 2.25 V
Lead Channel Setting Pacing Amplitude: 2.5 V
Lead Channel Setting Pacing Pulse Width: 0.4 ms
Lead Channel Setting Pacing Pulse Width: 0.4 ms
Lead Channel Setting Sensing Sensitivity: 0.3 mV

## 2019-06-22 ENCOUNTER — Encounter: Payer: Self-pay | Admitting: Cardiology

## 2019-06-22 NOTE — Progress Notes (Signed)
Remote ICD transmission.   

## 2019-07-31 ENCOUNTER — Other Ambulatory Visit: Payer: Self-pay | Admitting: Nurse Practitioner

## 2019-08-12 ENCOUNTER — Other Ambulatory Visit: Payer: Self-pay | Admitting: Nurse Practitioner

## 2019-08-22 ENCOUNTER — Other Ambulatory Visit: Payer: Self-pay | Admitting: Nurse Practitioner

## 2019-09-14 ENCOUNTER — Ambulatory Visit: Payer: BC Managed Care – PPO | Admitting: Cardiology

## 2019-09-14 ENCOUNTER — Ambulatory Visit (INDEPENDENT_AMBULATORY_CARE_PROVIDER_SITE_OTHER): Payer: BC Managed Care – PPO | Admitting: *Deleted

## 2019-09-14 ENCOUNTER — Encounter: Payer: Self-pay | Admitting: Cardiology

## 2019-09-14 ENCOUNTER — Other Ambulatory Visit: Payer: Self-pay

## 2019-09-14 VITALS — BP 120/80 | HR 55 | Ht 71.0 in | Wt 177.0 lb

## 2019-09-14 DIAGNOSIS — Z95810 Presence of automatic (implantable) cardiac defibrillator: Secondary | ICD-10-CM | POA: Diagnosis not present

## 2019-09-14 DIAGNOSIS — I428 Other cardiomyopathies: Secondary | ICD-10-CM | POA: Diagnosis not present

## 2019-09-14 LAB — CUP PACEART REMOTE DEVICE CHECK
Battery Remaining Longevity: 60 mo
Battery Voltage: 2.98 V
Brady Statistic AP VP Percent: 0.23 %
Brady Statistic AP VS Percent: 0.01 %
Brady Statistic AS VP Percent: 99.72 %
Brady Statistic AS VS Percent: 0.04 %
Brady Statistic RA Percent Paced: 0.24 %
Brady Statistic RV Percent Paced: 99.79 %
Date Time Interrogation Session: 20201231022704
HighPow Impedance: 68 Ohm
Implantable Lead Implant Date: 20180328
Implantable Lead Implant Date: 20180328
Implantable Lead Implant Date: 20180328
Implantable Lead Location: 753858
Implantable Lead Location: 753859
Implantable Lead Location: 753860
Implantable Lead Model: 5076
Implantable Pulse Generator Implant Date: 20180328
Lead Channel Impedance Value: 199.5 Ohm
Lead Channel Impedance Value: 212.8 Ohm
Lead Channel Impedance Value: 228 Ohm
Lead Channel Impedance Value: 228 Ohm
Lead Channel Impedance Value: 245.538
Lead Channel Impedance Value: 342 Ohm
Lead Channel Impedance Value: 399 Ohm
Lead Channel Impedance Value: 399 Ohm
Lead Channel Impedance Value: 399 Ohm
Lead Channel Impedance Value: 456 Ohm
Lead Channel Impedance Value: 456 Ohm
Lead Channel Impedance Value: 532 Ohm
Lead Channel Impedance Value: 665 Ohm
Lead Channel Impedance Value: 665 Ohm
Lead Channel Impedance Value: 722 Ohm
Lead Channel Impedance Value: 836 Ohm
Lead Channel Impedance Value: 893 Ohm
Lead Channel Impedance Value: 893 Ohm
Lead Channel Pacing Threshold Amplitude: 0.5 V
Lead Channel Pacing Threshold Amplitude: 0.625 V
Lead Channel Pacing Threshold Amplitude: 1.375 V
Lead Channel Pacing Threshold Pulse Width: 0.4 ms
Lead Channel Pacing Threshold Pulse Width: 0.4 ms
Lead Channel Pacing Threshold Pulse Width: 0.4 ms
Lead Channel Sensing Intrinsic Amplitude: 13.625 mV
Lead Channel Sensing Intrinsic Amplitude: 13.625 mV
Lead Channel Sensing Intrinsic Amplitude: 2.25 mV
Lead Channel Sensing Intrinsic Amplitude: 2.25 mV
Lead Channel Setting Pacing Amplitude: 1.5 V
Lead Channel Setting Pacing Amplitude: 2 V
Lead Channel Setting Pacing Amplitude: 2.5 V
Lead Channel Setting Pacing Pulse Width: 0.4 ms
Lead Channel Setting Pacing Pulse Width: 0.4 ms
Lead Channel Setting Sensing Sensitivity: 0.3 mV

## 2019-09-14 MED ORDER — LISINOPRIL 10 MG PO TABS: 10.0000 mg | ORAL_TABLET | Freq: Every day | ORAL | 3 refills | Status: DC

## 2019-09-14 MED ORDER — CARVEDILOL 6.25 MG PO TABS: 6.2500 mg | ORAL_TABLET | Freq: Two times a day (BID) | ORAL | 3 refills | Status: DC

## 2019-09-14 NOTE — Patient Instructions (Addendum)
Medication Instructions:  Your physician recommends that you continue on your current medications as directed. Please refer to the Current Medication list given to you today.  *If you need a refill on your cardiac medications before your next appointment, please call your pharmacy*  Lab Work: None ordered  If you have labs (blood work) drawn today and your tests are completely normal, you will receive your results only by: Marland Kitchen MyChart Message (if you have MyChart) OR . A paper copy in the mail If you have any lab test that is abnormal or we need to change your treatment, we will call you to review the results.  Testing/Procedures: None ordered   Follow-Up: At [facility], you and your health needs are our priority.  As part of our continuing mission to provide you with exceptional heart care, we have created designated Provider Care Teams.  These Care Teams include your primary Cardiologist (physician) and Advanced Practice Providers (APPs -  Physician Assistants and Nurse Practitioners) who all work together to provide you with the care you need, when you need it.  Your next appointment:   12 month(s)  The format for your next appointment:   In Person  Provider:   You may see Lesleigh Noe, MD or one of the following Advanced Practice Providers on your designated Care Team:    Norma Fredrickson, NP  Nada Boozer, NP  Georgie Chard, NP   Other Instructions  Lifestyle Modifications to Prevent and Treat Heart Disease -Recommend heart healthy/Mediterranean diet, with whole grains, fruits, vegetables, fish, lean meats, nuts, olive oil and avocado oil.  -Limit salt intake to less than 2000 mg per day.  -Recommend moderate walking, starting slowly with a few minutes and working up to 3-5 times/week for 30-50 minutes each session. Aim for at least 150 minutes.week. Goal should be pace of 3 miles/hours, or walking 1.5 miles in 30 minutes -Recommend avoidance of tobacco products.  Avoid excess alcohol. -Keep blood pressure well controlled, ideally less than 130/80.  --------------------------------------------------------   Mediterranean Diet A Mediterranean diet refers to food and lifestyle choices that are based on the traditions of countries located on the [employer] [place]. This way of eating has been shown to help prevent certain conditions and improve outcomes for people who have chronic diseases, like kidney disease and heart disease. What are tips for following this plan? Lifestyle  Cook and eat meals together with your family, when possible.  Drink enough fluid to keep your urine clear or pale yellow.  Be physically active every day. This includes: ? Aerobic exercise like running or swimming. ? Leisure activities like gardening, walking, or housework.  Get 7-8 hours of sleep each night.  If recommended by your health care provider, drink red wine in moderation. This means 1 glass a day for nonpregnant women and 2 glasses a day for men. A glass of wine equals 5 oz (150 mL). Reading food labels   Check the serving size of packaged foods. For foods such as rice and pasta, the serving size refers to the amount of cooked product, not dry.  Check the total fat in packaged foods. Avoid foods that have saturated fat or trans fats.  Check the ingredients list for added sugars, such as corn syrup. Shopping  At the grocery store, buy most of your food from the areas near the walls of the store. This includes: ? Fresh fruits and vegetables (produce). ? Grains, beans, nuts, and seeds. Some of these may be available  in unpackaged forms or large amounts (in bulk). ? Fresh seafood. ? Poultry and eggs. ? Low-fat dairy products.  Buy whole ingredients instead of prepackaged foods.  Buy fresh fruits and vegetables in-season from local farmers markets.  Buy frozen fruits and vegetables in resealable bags.  If you do not have access to quality fresh seafood,  buy precooked frozen shrimp or canned fish, such as tuna, salmon, or sardines.  Buy small amounts of raw or cooked vegetables, salads, or olives from the deli or salad bar at your store.  Stock your pantry so you always have certain foods on hand, such as olive oil, canned tuna, canned tomatoes, rice, pasta, and beans. Cooking  Cook foods with extra-virgin olive oil instead of using butter or other vegetable oils.  Have meat as a side dish, and have vegetables or grains as your main dish. This means having meat in small portions or adding small amounts of meat to foods like pasta or stew.  Use beans or vegetables instead of meat in common dishes like chili or lasagna.  Experiment with different cooking methods. Try roasting or broiling vegetables instead of steaming or sauteing them.  Add frozen vegetables to soups, stews, pasta, or rice.  Add nuts or seeds for added healthy fat at each meal. You can add these to yogurt, salads, or vegetable dishes.  Marinate fish or vegetables using olive oil, lemon juice, garlic, and fresh herbs. Meal planning   Plan to eat 1 vegetarian meal one day each week. Try to work up to 2 vegetarian meals, if possible.  Eat seafood 2 or more times a week.  Have healthy snacks readily available, such as: ? Vegetable sticks with hummus. ? [place] yogurt. ? Fruit and nut trail mix.  Eat balanced meals throughout the week. This includes: ? Fruit: 2-3 servings a day ? Vegetables: 4-5 servings a day ? Low-fat dairy: 2 servings a day ? Fish, poultry, or lean meat: 1 serving a day ? Beans and legumes: 2 or more servings a week ? Nuts and seeds: 1-2 servings a day ? Whole grains: 6-8 servings a day ? Extra-virgin olive oil: 3-4 servings a day  Limit red meat and sweets to only a few servings a month What are my food choices?  Mediterranean diet ? Recommended  Grains: Whole-grain pasta. Brown rice. Bulgar wheat. Polenta. Couscous. Whole-wheat bread.  Orpah Cobb.  Vegetables: Artichokes. Beets. Broccoli. Cabbage. Carrots. Eggplant. Green beans. Chard. Kale. Spinach. Onions. Leeks. Peas. Squash. Tomatoes. Peppers. Radishes.  Fruits: Apples. Apricots. Avocado. Berries. Bananas. Cherries. Dates. Figs. Grapes. Lemons. Melon. Oranges. Peaches. Plums. Pomegranate.  Meats and other protein foods: Beans. Almonds. Sunflower seeds. Pine nuts. Peanuts. Cod. Salmon. Scallops. Shrimp. Tuna. Tilapia. Clams. Oysters. Eggs.  Dairy: Low-fat milk. Cheese. Greek yogurt.  Beverages: Water. Red wine. Herbal tea.  Fats and oils: Extra virgin olive oil. Avocado oil. Grape seed oil.  Sweets and desserts: [place] yogurt with honey. Baked apples. Poached pears. Trail mix.  Seasoning and other foods: Basil. Cilantro. Coriander. Cumin. Mint. Parsley. Sage. Rosemary. Tarragon. Garlic. Oregano. Thyme. Pepper. Balsalmic vinegar. Tahini. Hummus. Tomato sauce. Olives. Mushrooms. ? Limit these  Grains: Prepackaged pasta or rice dishes. Prepackaged cereal with added sugar.  Vegetables: Deep fried potatoes (french fries).  Fruits: Fruit canned in syrup.  Meats and other protein foods: Beef. Pork. Lamb. Poultry with skin. Hot dogs. Tomasa Blase.  Dairy: Ice cream. Sour cream. Whole milk.  Beverages: Juice. Sugar-sweetened soft drinks. Beer. Liquor and spirits.  Fats and  oils: Butter. [place]ola oil. Vegetable oil. Beef fat (tallow). Lard.  Sweets and desserts: Cookies. Cakes. Pies. Candy.  Seasoning and other foods: Mayonnaise. Premade sauces and marinades. The items listed may not be a complete list. Talk with your dietitian about what dietary choices are right for you. Summary  The Mediterranean diet includes both food and lifestyle choices.  Eat a variety of fresh fruits and vegetables, beans, nuts, seeds, and whole grains.  Limit the amount of red meat and sweets that you eat.  Talk with your health care provider about whether it is safe for you to drink red  wine in moderation. This means 1 glass a day for nonpregnant women and 2 glasses a day for men. A glass of wine equals 5 oz (150 mL). This information is not intended to replace advice given to you by your health care provider. Make sure you discuss any questions you have with your health care provider. Document Revised: 04/30/2016 Document Reviewed: 04/23/2016 Elsevier Patient Education  [place].

## 2019-09-14 NOTE — Progress Notes (Signed)
Cardiology Office Note:    Date:  09/14/2019   ID:  Jeremy Clossl B Kimberley, DOB Nov 10, 1960, MRN [id number]  PCP:  Georgianne Fickamachandran, Ajith, MD  Cardiologist:  Lesleigh NoeHenry W Smith [name], MD  Referring MD: Georgianne Fickamachandran, Ajith, MD   Chief Complaint  Patient presents with  . Congestive Heart Failure    History of Present Illness:    Jeremy Wagner is a 58 y.o. male with a past medical history significant for hypertension, nonobstructive CAD, longstanding history of LV systolic dysfunction, LBBB, nonischemic cardiomyopathy, syncope 2018 and CRT-D implantation 11/2016.  The patient was last seen in the office on 08/08/2018 by Dr. Katrinka BlazingSmith at which time he was having some chest tightness that the patient correlated to taking cyclobenzaprine.  He was without evidence of heart failure signs or symptoms.  He was continued on low-dose beta-blocker and ACE inhibitor therapy for heart failure.  Cost has been an issue with Entresto in the past.  Pt is here today for routine follow up. He is no longer having the chest tightness. He occ has a dull pain in the upper left chest lasting only a few seconds. He has noticed this for several years. He thinks it may be related to acid reflux when he eats the wrong foods.   He lives with his wife. He works full time as a Arboriculturistcustodian and feels like he is fairly active at work. No specific exercise outside of work. He also mows 10 yards per week. He has no chest discomfort or shortness of breath with activity. H denies orthopnea, PND, edema, palpitations, lightheadedness or syncope.   He limits salt in his diet. His wife tries to make healthy meals. His son is a Engineer, civil (consulting)nurse at [employer]. Pt feels like their whole family is health conscious.   Cardiac studies   Nuclear Stress Myocardial Perfusion Imaging 07/2017: Study Highlights    Nuclear stress EF: 38%.  There was no ST segment deviation noted during stress.  This is an intermediate risk study.  The left ventricular ejection  fraction is moderately decreased (30-44%).  1. EF 38%, diffuse hypokinesis. 2. Low counts noted generally at rest and with stress.  3. Fixed small mild apical perfusion defect. Cannot rule out prior infarction. No ischemia.   Intermediate risk study due to low EF.       Past Medical History:  Diagnosis Date  . Anxiety   . Chronic systolic CHF (congestive heart failure) (HCC)   . GERD (gastroesophageal reflux disease)   . Heart murmur   . Hematuria   . Hypertension   . Insomnia   . LBBB (left bundle branch block)   . Nonischemic cardiomyopathy (HCC)    a. LVEF 35-40% by cath 01-13-08. Normal coronary arteries. b. 2D echo 2016: EF 30-35%.    Past Surgical History:  Procedure Laterality Date  . BIV ICD INSERTION CRT-D N/A 12/09/2016   Procedure: BiV ICD Insertion CRT-D;  Surgeon: Duke SalviaSteven C Klein, MD;  [facility];  Service: Cardiovascular;  Laterality: N/A;  . CARDIAC CATHETERIZATION  01/2008   clean Coronaries, HTN CM??; EF ~30-35%    Current Medications: Current Meds  Medication Sig  . aspirin EC 81 MG tablet Take 1 tablet (81 mg total) by mouth daily.  Marland Kitchen. atorvastatin (LIPITOR) 10 MG tablet Take 10 mg by mouth daily.  . carvedilol (COREG) 6.25 MG tablet Take 1 tablet (6.25 mg total) by mouth 2 (two) times daily with a meal.  . cyclobenzaprine (FLEXERIL) 10 MG tablet  Take 1 tablet (10 mg total) by mouth 2 (two) times daily as needed for muscle spasms.  Marland Kitchen lidocaine (LIDODERM) 5 % Place 1 patch onto the skin daily. Remove & Discard patch within 12 hours or as directed by MD  . lisinopril (ZESTRIL) 10 MG tablet Take 1 tablet (10 mg total) by mouth daily.  . nitroGLYCERIN (NITROSTAT) 0.4 MG SL tablet Place 1 tablet (0.4 mg total) under the tongue every 5 (five) minutes as needed for chest pain.  Marland Kitchen tiZANidine (ZANAFLEX) 4 MG tablet Take 1 tablet by mouth as needed.  . [DISCONTINUED] carvedilol (COREG) 6.25 MG tablet TAKE 1 TABLET (6.25 MG TOTAL) BY MOUTH 2 (TWO)  TIMES DAILY WITH A MEAL.  . [DISCONTINUED] lisinopril (ZESTRIL) 10 MG tablet Take 1 tablet (10 mg total) by mouth daily. Please call office to schedule appointment for any future refills. Please call [phone] to schedule appointment. 2nd attempt.     Allergies:   Patient has no known allergies.   Social History   Socioeconomic History  . Marital status: Married    Spouse name: Not on file  . Number of children: Not on file  . Years of education: Not on file  . Highest education level: Not on file  Occupational History  . Not on file  Tobacco Use  . Smoking status: Never Smoker  . Smokeless tobacco: Never Used  Substance and Sexual Activity  . Alcohol use: No    Alcohol/week: 0.0 standard drinks  . Drug use: No  . Sexual activity: Not on file  Other Topics Concern  . Not on file  Social History Narrative   Works for the city   Physically very active job.   Has 2 summer jobs both of which are very physical   [place] a lot   Drinks ~ 24 Oz of caffeine in am   3 kids   Married   Lives in [place] [employer]   Never smoker   Social Determinants of Health   Financial Resource Strain:   . Difficulty of Paying Living Expenses: Not on file  Food Insecurity:   . Worried About Charity fundraiser in the Last Year: Not on file  . Ran Out of Food in the Last Year: Not on file  Transportation Needs:   . Lack of Transportation (Medical): Not on file  . Lack of Transportation (Non-Medical): Not on file  Physical Activity:   . Days of Exercise per Week: Not on file  . Minutes of Exercise per Session: Not on file  Stress:   . Feeling of Stress : Not on file  Social Connections:   . Frequency of Communication with Friends and Family: Not on file  . Frequency of Social Gatherings with Friends and Family: Not on file  . Attends Religious Services: Not on file  . Active Member of Clubs or Organizations: Not on file  . Attends Archivist Meetings: Not on file  .  Marital Status: Not on file     Family History: The patient's family history includes CAD (age of onset: 101) in his paternal aunt; Coronary artery disease (age of onset: 47) in his paternal uncle; Hypertension in his father and mother. ROS:   Please see the history of present illness.     All other systems reviewed and are negative.   EKG:  EKG is ordered today.  The ekg ordered today demonstrates Ventricular pacing at 55 bpm.   Recent Labs: No results  found for requested labs within last 8760 hours.   Recent Lipid Panel No results found for: CHOL, TRIG, HDL, CHOLHDL, VLDL, LDLCALC, LDLDIRECT  Physical Exam:    VS:  BP 120/80   Pulse (!) 55   Ht 5\' 11"  (1.803 m)   Wt 177 lb (80.3 kg)   BMI 24.69 kg/m     Wt Readings from Last 6 Encounters:  09/14/19 177 lb (80.3 kg)  08/08/18 174 lb 6.4 oz (79.1 kg)  03/16/18 172 lb (78 kg)  01/26/18 172 lb 1.9 oz (78.1 kg)  07/08/17 174 lb (78.9 kg)  03/18/17 170 lb (77.1 kg)     Physical Exam  Constitutional: He is oriented to person, place, and time. He appears well-developed and well-nourished. No distress.  HENT:  Head: Normocephalic and atraumatic.  Neck: No JVD present.  Cardiovascular: Normal rate, regular rhythm, normal heart sounds and intact distal pulses. Exam reveals no gallop and no friction rub.  No murmur heard. Pulmonary/Chest: Effort normal and breath sounds normal. No respiratory distress. He has no wheezes. He has no rales.  Abdominal: Soft. Bowel sounds are normal.  Musculoskeletal:        General: No edema. Normal range of motion.     Cervical back: Normal range of motion and neck supple.  Neurological: He is alert and oriented to person, place, and time.  Skin: Skin is warm and dry.  Psychiatric: He has a normal mood and affect. His behavior is normal. Judgment and thought content normal.  Vitals reviewed.   ASSESSMENT:    1. Nonischemic cardiomyopathy (HCC)   2. AICD (automatic  cardioverter/defibrillator) present    PLAN:    In order of problems listed above:  Chronic systolic heart failure/nonischemic cardiomyopathy -NYHA class II -Patient continues on carvedilol 6.25 mg twice daily and lisinopril 10 mg daily. -Pt is doing exceptionally well. He is very active with no symptoms.  -continue current therapy.   Status post CRT-D implantation 11/2016 -Followed by Dr. Graciela HusbandsKlein -No ICD discharges.  Most recent remote device check on 06/15/2019 with normal device function and good battery life. Download was done this morning, pending review.    Medication Adjustments/Labs and Tests Ordered: Current medicines are reviewed at length with the patient today.  Concerns regarding medicines are outlined above. Labs and tests ordered and medication changes are outlined in the patient instructions below:  Patient Instructions  Medication Instructions:  Your physician recommends that you continue on your current medications as directed. Please refer to the Current Medication list given to you today.  *If you need a refill on your cardiac medications before your next appointment, please call your pharmacy*  Lab Work: None ordered  If you have labs (blood work) drawn today and your tests are completely normal, you will receive your results only by: Marland Kitchen. MyChart Message (if you have MyChart) OR . A paper copy in the mail If you have any lab test that is abnormal or we need to change your treatment, we will call you to review the results.  Testing/Procedures: None ordered   Follow-Up: At [facility], you and your health needs are our priority.  As part of our continuing mission to provide you with exceptional heart care, we have created designated Provider Care Teams.  These Care Teams include your primary Cardiologist (physician) and Advanced Practice Providers (APPs -  Physician Assistants and Nurse Practitioners) who all work together to provide you with the care you need,  when you need it.  Your  next appointment:   12 month(s)  The format for your next appointment:   In Person  Provider:   You may see Lesleigh Noe, MD or one of the following Advanced Practice Providers on your designated Care Team:    Norma Fredrickson, NP  Nada Boozer, NP  Georgie Chard, NP   Other Instructions  Lifestyle Modifications to Prevent and Treat Heart Disease -Recommend heart healthy/Mediterranean diet, with whole grains, fruits, vegetables, fish, lean meats, nuts, olive oil and avocado oil.  -Limit salt intake to less than 2000 mg per day.  -Recommend moderate walking, starting slowly with a few minutes and working up to 3-5 times/week for 30-50 minutes each session. Aim for at least 150 minutes.week. Goal should be pace of 3 miles/hours, or walking 1.5 miles in 30 minutes -Recommend avoidance of tobacco products. Avoid excess alcohol. -Keep blood pressure well controlled, ideally less than 130/80.  --------------------------------------------------------   Mediterranean Diet A Mediterranean diet refers to food and lifestyle choices that are based on the traditions of countries located on the [employer]. This way of eating has been shown to help prevent certain conditions and improve outcomes for people who have chronic diseases, like kidney disease and heart disease. What are tips for following this plan? Lifestyle  Cook and eat meals together with your family, when possible.  Drink enough fluid to keep your urine clear or pale yellow.  Be physically active every day. This includes: ? Aerobic exercise like running or swimming. ? Leisure activities like gardening, walking, or housework.  Get 7-8 hours of sleep each night.  If recommended by your health care provider, drink red wine in moderation. This means 1 glass a day for nonpregnant women and 2 glasses a day for men. A glass of wine equals 5 oz (150 mL). Reading food labels   Check the serving  size of packaged foods. For foods such as rice and pasta, the serving size refers to the amount of cooked product, not dry.  Check the total fat in packaged foods. Avoid foods that have saturated fat or trans fats.  Check the ingredients list for added sugars, such as corn syrup. Shopping  At the grocery store, buy most of your food from the areas near the walls of the store. This includes: ? Fresh fruits and vegetables (produce). ? Grains, beans, nuts, and seeds. Some of these may be available in unpackaged forms or large amounts (in bulk). ? Fresh seafood. ? Poultry and eggs. ? Low-fat dairy products.  Buy whole ingredients instead of prepackaged foods.  Buy fresh fruits and vegetables in-season from local farmers markets.  Buy frozen fruits and vegetables in resealable bags.  If you do not have access to quality fresh seafood, buy precooked frozen shrimp or canned fish, such as tuna, salmon, or sardines.  Buy small amounts of raw or cooked vegetables, salads, or olives from the deli or salad bar at your store.  Stock your pantry so you always have certain foods on hand, such as olive oil, canned tuna, canned tomatoes, rice, pasta, and beans. Cooking  Cook foods with extra-virgin olive oil instead of using butter or other vegetable oils.  Have meat as a side dish, and have vegetables or grains as your main dish. This means having meat in small portions or adding small amounts of meat to foods like pasta or stew.  Use beans or vegetables instead of meat in common dishes like chili or lasagna.  Experiment with different cooking methods.  Try roasting or broiling vegetables instead of steaming or sauteing them.  Add frozen vegetables to soups, stews, pasta, or rice.  Add nuts or seeds for added healthy fat at each meal. You can add these to yogurt, salads, or vegetable dishes.  Marinate fish or vegetables using olive oil, lemon juice, garlic, and fresh herbs. Meal  planning   Plan to eat 1 vegetarian meal one day each week. Try to work up to 2 vegetarian meals, if possible.  Eat seafood 2 or more times a week.  Have healthy snacks readily available, such as: ? Vegetable sticks with hummus. ? [place] yogurt. ? Fruit and nut trail mix.  Eat balanced meals throughout the week. This includes: ? Fruit: 2-3 servings a day ? Vegetables: 4-5 servings a day ? Low-fat dairy: 2 servings a day ? Fish, poultry, or lean meat: 1 serving a day ? Beans and legumes: 2 or more servings a week ? Nuts and seeds: 1-2 servings a day ? Whole grains: 6-8 servings a day ? Extra-virgin olive oil: 3-4 servings a day  Limit red meat and sweets to only a few servings a month What are my food choices?  Mediterranean diet ? Recommended  Grains: Whole-grain pasta. Brown rice. Bulgar wheat. Polenta. Couscous. Whole-wheat bread. Orpah Cobb.  Vegetables: Artichokes. Beets. Broccoli. Cabbage. Carrots. Eggplant. Green beans. Chard. Kale. Spinach. Onions. Leeks. Peas. Squash. Tomatoes. Peppers. Radishes.  Fruits: Apples. Apricots. Avocado. Berries. Bananas. Cherries. Dates. Figs. Grapes. Lemons. Melon. Oranges. Peaches. Plums. Pomegranate.  Meats and other protein foods: Beans. Almonds. Sunflower seeds. Pine nuts. Peanuts. Cod. Salmon. Scallops. Shrimp. Tuna. Tilapia. Clams. Oysters. Eggs.  Dairy: Low-fat milk. Cheese. Greek yogurt.  Beverages: Water. Red wine. Herbal tea.  Fats and oils: Extra virgin olive oil. Avocado oil. Grape seed oil.  Sweets and desserts: [place] yogurt with honey. Baked apples. Poached pears. Trail mix.  Seasoning and other foods: Basil. Cilantro. Coriander. Cumin. Mint. Parsley. Sage. Rosemary. Tarragon. Garlic. Oregano. Thyme. Pepper. Balsalmic vinegar. Tahini. Hummus. Tomato sauce. Olives. Mushrooms. ? Limit these  Grains: Prepackaged pasta or rice dishes. Prepackaged cereal with added sugar.  Vegetables: Deep fried potatoes (french  fries).  Fruits: Fruit canned in syrup.  Meats and other protein foods: Beef. Pork. Lamb. Poultry with skin. Hot dogs. Tomasa Blase.  Dairy: Ice cream. Sour cream. Whole milk.  Beverages: Juice. Sugar-sweetened soft drinks. Beer. Liquor and spirits.  Fats and oils: Butter. Canola oil. Vegetable oil. Beef fat (tallow). Lard.  Sweets and desserts: Cookies. Cakes. Pies. Candy.  Seasoning and other foods: Mayonnaise. Premade sauces and marinades. The items listed may not be a complete list. Talk with your dietitian about what dietary choices are right for you. Summary  The Mediterranean diet includes both food and lifestyle choices.  Eat a variety of fresh fruits and vegetables, beans, nuts, seeds, and whole grains.  Limit the amount of red meat and sweets that you eat.  Talk with your health care provider about whether it is safe for you to drink red wine in moderation. This means 1 glass a day for nonpregnant women and 2 glasses a day for men. A glass of wine equals 5 oz (150 mL). This information is not intended to replace advice given to you by your health care provider. Make sure you discuss any questions you have with your health care provider. Document Revised: 04/30/2016 Document Reviewed: 04/23/2016 Elsevier Patient Education  [address]      Signed, Berton Bon, NP  09/15/2019 9:31 AM  [facility]

## 2019-09-15 NOTE — Progress Notes (Signed)
ICD remote 

## 2019-10-11 NOTE — Progress Notes (Signed)
Cardiology Office Note Date:  10/13/2019  Patient ID:  Jeremy Wagner, Jeremy Wagner February 26, 1961, MRN [id number] PCP:  Georgianne Fick, MD  Cardiologist:  Dr. [name] Electrophysiologist; Dr. Graciela Husbands    Chief Complaint:  over due device visit  History of Present Illness: Jeremy Wagner is a 59 y.o. male with history of HTN, non-obstructive CAD, NICM, chronic CHF (systolic), LBBB, syncope >> CRT-D  He comes in today to be seen for dr. Graciela Husbands, last seen by him July 2019, at that time doing well, he was changed from ACE > Entresto.  Most recently he saw J. 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  Sherryll Burger was cost prohibitive, on BB/ACE.  He is doing well.  He works as a Arboriculturist for [employer], denies any exertional incapacities, no SOB, DOE, no dizzy spells, near syncope or syncope.  He mentions a CP, this is not exertional, infact says he never has CP when active, working, only at rest, and particularly when supine.  He mentions a slight burning discomfort central, non-radiating, no associated symptoms, will often resolve spontaneously, though sometimes will sit up and is better He says this happens infrequently and is not new.  Goes back years and if anything better then it has been.  Mentions that this pain has been worse in the past and Dr. [name] has done many tests to evaluate this.  In the end was told likely was GI/GERD.  Device information MDT CRT-D, implanted 12/09/2016  Past Medical History:  Diagnosis Date  . Anxiety   . Chronic systolic CHF (congestive heart failure) (HCC)   . GERD (gastroesophageal reflux disease)   . Heart murmur   . Hematuria   . Hypertension   . Insomnia   . LBBB (left bundle branch block)   . Nonischemic cardiomyopathy (HCC)    a. LVEF 35-40% by cath 01-13-08. Normal coronary arteries. b. 2D echo 2016: EF 30-35%.    Past Surgical History:  Procedure  Laterality Date  . BIV ICD INSERTION CRT-D N/A 12/09/2016   Procedure: BiV ICD Insertion CRT-D;  Surgeon: Duke Salvia, MD;  Location: [facility] INVASIVE CV LAB;  Service: Cardiovascular;  Laterality: N/A;  . CARDIAC CATHETERIZATION  01/2008   clean Coronaries, HTN CM??; EF ~30-35%    Current Outpatient Medications  Medication Sig Dispense Refill  . aspirin EC 81 MG tablet Take 1 tablet (81 mg total) by mouth daily. 30 tablet 0  . atorvastatin (LIPITOR) 10 MG tablet Take 10 mg by mouth daily.    . carvedilol (COREG) 12.5 MG tablet Take 1 tablet (12.5 mg total) by mouth 2 (two) times daily with a meal. 180 tablet 3  . cyclobenzaprine (FLEXERIL) 10 MG tablet Take 1 tablet (10 mg total) by mouth 2 (two) times daily as needed for muscle spasms. 20 tablet 0  . lidocaine (LIDODERM) 5 % Place 1 patch onto the skin daily. Remove & Discard patch within 12 hours or as directed by MD 10 patch 0  . lisinopril (ZESTRIL) 10 MG tablet Take 1 tablet (10 mg total) by mouth daily. 90 tablet 3  . nitroGLYCERIN (NITROSTAT) 0.4 MG SL tablet Place 1 tablet (0.4 mg total) under the tongue every 5 (five) minutes as needed for chest pain. 25 tablet 3  . tiZANidine (ZANAFLEX) 4 MG tablet Take 1 tablet by mouth as needed.  3   No current facility-administered medications for this visit.  Allergies:   Patient has no known allergies.   Social History:  The patient  reports that he has never smoked. He has never used smokeless tobacco. He reports that he does not drink alcohol or use drugs.   Family History:  The patient's family history includes CAD (age of onset: 29) in his paternal aunt; Coronary artery disease (age of onset: 38) in his paternal uncle; Hypertension in his father and mother.  ROS:  Please see the history of present illness.  All other systems are reviewed and otherwise negative.   PHYSICAL EXAM:  VS:  BP 132/86   Pulse 75   Ht 5\' 11"  (1.803 m)   Wt 177 lb (80.3 kg)   BMI 24.69 kg/m  BMI: Body  mass index is 24.69 kg/m. Well nourished, well developed, in no acute distress  HEENT: normocephalic, atraumatic  Neck: no JVD, carotid bruits or masses Cardiac:  RRR; no significant murmurs, no rubs, or gallops Lungs:  CTA b/l, no wheezing, rhonchi or rales  Abd: soft, nontender MS: no deformity or atrophy Ext: no edema  Skin: warm and dry, no rash Neuro:  No gross deficits appreciated Psych: euthymic mood, full affect  ICD site is stable, no tethering or discomfort   EKG:  Reviewed 09/14/2019, SR/V paced, no clear ischemic changes or significant change from prior  ICD interrogation done today and reviewed by myself;  Battery and lead measurements are good One NSVT or 8 seconds noted as a V sensed event, 157-166bpm All other VS events are 1:1, longest 104min49sec VP and effective VP is 99.7%    Nuclear Stress Myocardial Perfusion Imaging 07/2017: Study Highlights    Nuclear stress EF: 38%.  There was no ST segment deviation noted during stress.  This is an intermediate risk study.  The left ventricular ejection fraction is moderately decreased (30-44%).  1. EF 38%, diffuse hypokinesis. 2. Low counts noted generally at rest and with stress.  3. Fixed small mild apical perfusion defect. Cannot rule out prior infarction. No ischemia.   Intermediate risk study due to low EF.     12/06/2016: TTE Study Conclusions - Left ventricle: The cavity size was normal. Wall thickness was   increased in a pattern of mild LVH. Systolic function was   severely reduced. The estimated ejection fraction was in the   range of 20% to 25%. Diffuse hypokinesis. Doppler parameters are   consistent with abnormal left ventricular relaxation (grade 1   diastolic dysfunction). - Regional wall motion abnormality: Akinesis of the mid anterior   and basal-mid anteroseptal myocardium. - Aortic valve: Mildly calcified annulus. Trileaflet; normal   thickness leaflets. Valve area (VTI): 1.69  cm^2. Valve area   (Vmax): 1.93 cm^2. Valve area (Vmean): 1.83 cm^2. - Left atrium: The atrium was mildly dilated. - Atrial septum: No defect or patent foramen ovale was identified. - Technically adequate study.   07/16/2017, stress myoview  Nuclear stress EF: 38%.  There was no ST segment deviation noted during stress.  This is an intermediate risk study.  The left ventricular ejection fraction is moderately decreased (30-44%).   1. EF 38%, diffuse hypokinesis. 2. Low counts noted generally at rest and with stress.  3. Fixed small mild apical perfusion defect. Cannot rule out prior infarction.  No ischemia.   Intermediate risk study due to low EF.   Recent Labs: No results found for requested labs within last 8760 hours.  No results found for requested labs within last 8760 hours.  CrCl cannot be calculated (Patient's most recent lab result is older than the maximum 21 days allowed.).   Wt Readings from Last 3 Encounters:  10/13/19 177 lb (80.3 kg)  09/14/19 177 lb (80.3 kg)  08/08/18 174 lb 6.4 oz (79.1 kg)     Other studies reviewed: Additional studies/records reviewed today include: summarized above  ASSESSMENT AND PLAN:  1. CRT-D     intct function, no programming changes made  2. NICM 3. Chronic CHF (systolic)     No symptoms or exam findings of volume OL     OptiVol looks good     On BB/ACE tx  4. CP     Atypical, not new or escalating     Chart reports non-cobstructive CAD     Dates back years well before his last ischemic w/u and prior      Recommend he discuss with his PMD, potential non-cardiac causes  5. NSVT     No symptoms     Will check his lytes today    Disposition: F/u with   Current medicines are reviewed at length with the patient today.  The patient did not have any concerns regarding medicines.  Norma Fredrickson, PA-C 10/13/2019 11:30 AM     [facility] [address] [place] [address] [phone] (office)  [phone] (fax)

## 2019-10-13 ENCOUNTER — Ambulatory Visit: Payer: BC Managed Care – PPO | Admitting: Physician Assistant

## 2019-10-13 ENCOUNTER — Other Ambulatory Visit: Payer: Self-pay

## 2019-10-13 VITALS — BP 132/86 | HR 75 | Ht 71.0 in | Wt 177.0 lb

## 2019-10-13 DIAGNOSIS — R079 Chest pain, unspecified: Secondary | ICD-10-CM

## 2019-10-13 DIAGNOSIS — I472 Ventricular tachycardia: Secondary | ICD-10-CM

## 2019-10-13 DIAGNOSIS — I5022 Chronic systolic (congestive) heart failure: Secondary | ICD-10-CM

## 2019-10-13 DIAGNOSIS — I428 Other cardiomyopathies: Secondary | ICD-10-CM | POA: Diagnosis not present

## 2019-10-13 DIAGNOSIS — I4729 Other ventricular tachycardia: Secondary | ICD-10-CM

## 2019-10-13 DIAGNOSIS — Z95810 Presence of automatic (implantable) cardiac defibrillator: Secondary | ICD-10-CM

## 2019-10-13 LAB — BASIC METABOLIC PANEL
BUN/Creatinine Ratio: 21 — ABNORMAL HIGH (ref 9–20)
BUN: 19 mg/dL (ref 6–24)
CO2: 26 mmol/L (ref 20–29)
Calcium: 9.6 mg/dL (ref 8.7–10.2)
Chloride: 94 mmol/L — ABNORMAL LOW (ref 96–106)
Creatinine, Ser: 0.9 mg/dL (ref 0.76–1.27)
GFR calc Af Amer: 108 mL/min/{1.73_m2} (ref >59)
GFR calc non Af Amer: 93 mL/min/{1.73_m2} (ref >59)
Glucose: 96 mg/dL (ref 65–99)
Potassium: 3.8 mmol/L (ref 3.5–5.2)
Sodium: 136 mmol/L (ref 134–144)

## 2019-10-13 LAB — MAGNESIUM: Magnesium: 2.1 mg/dL (ref 1.6–2.3)

## 2019-10-13 MED ORDER — NITROGLYCERIN 0.4 MG SL SUBL: 0.4000 mg | SUBLINGUAL_TABLET | SUBLINGUAL | 3 refills | Status: DC | PRN

## 2019-10-13 MED ORDER — CARVEDILOL 12.5 MG PO TABS: 12.5000 mg | ORAL_TABLET | Freq: Two times a day (BID) | ORAL | 3 refills | Status: DC

## 2019-10-13 NOTE — Patient Instructions (Signed)
Medication Instructions:    START TAKING COREG 12.5  MG TWICE A DAY    *If you need a refill on your Cardiac medications before your next appointment, please call your pharmacy*  Lab Work:  BMET AND MAG   If you have labs (blood work) drawn today and your tests are completely normal, you will receive your results only by: Marland Kitchen MyChart Message (if you have MyChart) OR . A paper copy in the mail If you have any lab test that is abnormal or we need to change your treatment, we will call you to review the results.  Testing/Procedures: NONE ORDERED  TODAY'   Follow-Up: At [facility], you and your health needs are our priority.  As part of our continuing mission to provide you with exceptional heart care, we have created designated Provider Care Teams.  These Care Teams include your primary Cardiologist (physician) and Advanced Practice Providers (APPs -  Physician Assistants and Nurse Practitioners) who all work together to provide you with the care you need, when you need it.  Your next appointment:   1 year(s)  The format for your next appointment:   In Person  Provider:   You may see  Dr. Graciela Husbands or one of the following Advanced Practice Providers on your designated Care Team:    Gypsy Balsam, NP  Francis Dowse, PA-C  Casimiro Needle "Otilio Saber, [place]   Other Instructions

## 2019-12-14 ENCOUNTER — Ambulatory Visit (INDEPENDENT_AMBULATORY_CARE_PROVIDER_SITE_OTHER): Payer: BC Managed Care – PPO | Admitting: *Deleted

## 2019-12-14 DIAGNOSIS — Z95810 Presence of automatic (implantable) cardiac defibrillator: Secondary | ICD-10-CM | POA: Diagnosis not present

## 2019-12-14 LAB — CUP PACEART REMOTE DEVICE CHECK
Battery Remaining Longevity: 55 mo
Battery Voltage: 2.98 V
Brady Statistic AP VP Percent: 0.66 %
Brady Statistic AP VS Percent: 0.01 %
Brady Statistic AS VP Percent: 99.29 %
Brady Statistic AS VS Percent: 0.04 %
Brady Statistic RA Percent Paced: 0.67 %
Brady Statistic RV Percent Paced: 99.66 %
Date Time Interrogation Session: 20210401042403
HighPow Impedance: 66 Ohm
Implantable Lead Implant Date: 20180328
Implantable Lead Implant Date: 20180328
Implantable Lead Implant Date: 20180328
Implantable Lead Location: 753858
Implantable Lead Location: 753859
Implantable Lead Location: 753860
Implantable Lead Model: 5076
Implantable Pulse Generator Implant Date: 20180328
Lead Channel Impedance Value: 208.568
Lead Channel Impedance Value: 218.5 Ohm
Lead Channel Impedance Value: 224.438
Lead Channel Impedance Value: 235.98 Ohm
Lead Channel Impedance Value: 235.98 Ohm
Lead Channel Impedance Value: 323 Ohm
Lead Channel Impedance Value: 399 Ohm
Lead Channel Impedance Value: 399 Ohm
Lead Channel Impedance Value: 437 Ohm
Lead Channel Impedance Value: 437 Ohm
Lead Channel Impedance Value: 437 Ohm
Lead Channel Impedance Value: 513 Ohm
Lead Channel Impedance Value: 646 Ohm
Lead Channel Impedance Value: 665 Ohm
Lead Channel Impedance Value: 665 Ohm
Lead Channel Impedance Value: 817 Ohm
Lead Channel Impedance Value: 836 Ohm
Lead Channel Impedance Value: 836 Ohm
Lead Channel Pacing Threshold Amplitude: 0.5 V
Lead Channel Pacing Threshold Amplitude: 0.75 V
Lead Channel Pacing Threshold Amplitude: 1.5 V
Lead Channel Pacing Threshold Pulse Width: 0.4 ms
Lead Channel Pacing Threshold Pulse Width: 0.4 ms
Lead Channel Pacing Threshold Pulse Width: 0.4 ms
Lead Channel Sensing Intrinsic Amplitude: 12.875 mV
Lead Channel Sensing Intrinsic Amplitude: 12.875 mV
Lead Channel Sensing Intrinsic Amplitude: 2 mV
Lead Channel Sensing Intrinsic Amplitude: 2 mV
Lead Channel Setting Pacing Amplitude: 1.5 V
Lead Channel Setting Pacing Amplitude: 2 V
Lead Channel Setting Pacing Amplitude: 2.5 V
Lead Channel Setting Pacing Pulse Width: 0.4 ms
Lead Channel Setting Pacing Pulse Width: 0.4 ms
Lead Channel Setting Sensing Sensitivity: 0.3 mV

## 2019-12-14 NOTE — Progress Notes (Signed)
ICD Remote  

## 2020-02-07 ENCOUNTER — Other Ambulatory Visit: Payer: Self-pay | Admitting: Physician Assistant

## 2020-03-14 ENCOUNTER — Ambulatory Visit (INDEPENDENT_AMBULATORY_CARE_PROVIDER_SITE_OTHER): Payer: BC Managed Care – PPO | Admitting: *Deleted

## 2020-03-14 DIAGNOSIS — I428 Other cardiomyopathies: Secondary | ICD-10-CM | POA: Diagnosis not present

## 2020-03-14 LAB — CUP PACEART REMOTE DEVICE CHECK
Battery Remaining Longevity: 51 mo
Battery Voltage: 2.97 V
Brady Statistic AP VP Percent: 0.96 %
Brady Statistic AP VS Percent: 0.01 %
Brady Statistic AS VP Percent: 98.99 %
Brady Statistic AS VS Percent: 0.04 %
Brady Statistic RA Percent Paced: 0.97 %
Brady Statistic RV Percent Paced: 99.82 %
Date Time Interrogation Session: 20210701033425
HighPow Impedance: 66 Ohm
Implantable Lead Implant Date: 20180328
Implantable Lead Implant Date: 20180328
Implantable Lead Implant Date: 20180328
Implantable Lead Location: 753858
Implantable Lead Location: 753859
Implantable Lead Location: 753860
Implantable Lead Model: 5076
Implantable Pulse Generator Implant Date: 20180328
Lead Channel Impedance Value: 194.634
Lead Channel Impedance Value: 194.634
Lead Channel Impedance Value: 218.298
Lead Channel Impedance Value: 218.298
Lead Channel Impedance Value: 224.438
Lead Channel Impedance Value: 304 Ohm
Lead Channel Impedance Value: 380 Ohm
Lead Channel Impedance Value: 380 Ohm
Lead Channel Impedance Value: 399 Ohm
Lead Channel Impedance Value: 399 Ohm
Lead Channel Impedance Value: 456 Ohm
Lead Channel Impedance Value: 513 Ohm
Lead Channel Impedance Value: 646 Ohm
Lead Channel Impedance Value: 646 Ohm
Lead Channel Impedance Value: 665 Ohm
Lead Channel Impedance Value: 779 Ohm
Lead Channel Impedance Value: 817 Ohm
Lead Channel Impedance Value: 817 Ohm
Lead Channel Pacing Threshold Amplitude: 0.625 V
Lead Channel Pacing Threshold Amplitude: 0.875 V
Lead Channel Pacing Threshold Amplitude: 1.25 V
Lead Channel Pacing Threshold Pulse Width: 0.4 ms
Lead Channel Pacing Threshold Pulse Width: 0.4 ms
Lead Channel Pacing Threshold Pulse Width: 0.4 ms
Lead Channel Sensing Intrinsic Amplitude: 13.625 mV
Lead Channel Sensing Intrinsic Amplitude: 13.625 mV
Lead Channel Sensing Intrinsic Amplitude: 2.25 mV
Lead Channel Sensing Intrinsic Amplitude: 2.25 mV
Lead Channel Setting Pacing Amplitude: 1.5 V
Lead Channel Setting Pacing Amplitude: 2 V
Lead Channel Setting Pacing Amplitude: 2.5 V
Lead Channel Setting Pacing Pulse Width: 0.4 ms
Lead Channel Setting Pacing Pulse Width: 0.4 ms
Lead Channel Setting Sensing Sensitivity: 0.3 mV

## 2020-03-15 NOTE — Progress Notes (Signed)
Remote ICD transmission.   

## 2020-06-13 ENCOUNTER — Ambulatory Visit (INDEPENDENT_AMBULATORY_CARE_PROVIDER_SITE_OTHER): Payer: BC Managed Care – PPO

## 2020-06-13 DIAGNOSIS — I5022 Chronic systolic (congestive) heart failure: Secondary | ICD-10-CM

## 2020-06-13 DIAGNOSIS — I428 Other cardiomyopathies: Secondary | ICD-10-CM

## 2020-06-13 LAB — CUP PACEART REMOTE DEVICE CHECK
Battery Remaining Longevity: 44 mo
Battery Voltage: 2.97 V
Brady Statistic AP VP Percent: 1.46 %
Brady Statistic AP VS Percent: 0.01 %
Brady Statistic AS VP Percent: 98.49 %
Brady Statistic AS VS Percent: 0.04 %
Brady Statistic RA Percent Paced: 1.48 %
Brady Statistic RV Percent Paced: 99.87 %
Date Time Interrogation Session: 20210930033325
HighPow Impedance: 65 Ohm
Implantable Lead Implant Date: 20180328
Implantable Lead Implant Date: 20180328
Implantable Lead Implant Date: 20180328
Implantable Lead Location: 753858
Implantable Lead Location: 753859
Implantable Lead Location: 753860
Implantable Lead Model: 5076
Implantable Pulse Generator Implant Date: 20180328
Lead Channel Impedance Value: 199.5 Ohm
Lead Channel Impedance Value: 208.568
Lead Channel Impedance Value: 224.438
Lead Channel Impedance Value: 224.438
Lead Channel Impedance Value: 235.98 Ohm
Lead Channel Impedance Value: 304 Ohm
Lead Channel Impedance Value: 380 Ohm
Lead Channel Impedance Value: 399 Ohm
Lead Channel Impedance Value: 399 Ohm
Lead Channel Impedance Value: 437 Ohm
Lead Channel Impedance Value: 437 Ohm
Lead Channel Impedance Value: 513 Ohm
Lead Channel Impedance Value: 646 Ohm
Lead Channel Impedance Value: 665 Ohm
Lead Channel Impedance Value: 703 Ohm
Lead Channel Impedance Value: 760 Ohm
Lead Channel Impedance Value: 817 Ohm
Lead Channel Impedance Value: 817 Ohm
Lead Channel Pacing Threshold Amplitude: 0.5 V
Lead Channel Pacing Threshold Amplitude: 1 V
Lead Channel Pacing Threshold Amplitude: 1.375 V
Lead Channel Pacing Threshold Pulse Width: 0.4 ms
Lead Channel Pacing Threshold Pulse Width: 0.4 ms
Lead Channel Pacing Threshold Pulse Width: 0.4 ms
Lead Channel Sensing Intrinsic Amplitude: 11.375 mV
Lead Channel Sensing Intrinsic Amplitude: 11.375 mV
Lead Channel Sensing Intrinsic Amplitude: 2.125 mV
Lead Channel Sensing Intrinsic Amplitude: 2.125 mV
Lead Channel Setting Pacing Amplitude: 1.5 V
Lead Channel Setting Pacing Amplitude: 2.5 V
Lead Channel Setting Pacing Amplitude: 2.5 V
Lead Channel Setting Pacing Pulse Width: 0.4 ms
Lead Channel Setting Pacing Pulse Width: 0.4 ms
Lead Channel Setting Sensing Sensitivity: 0.3 mV

## 2020-06-17 NOTE — Progress Notes (Signed)
Remote ICD transmission.   

## 2020-08-08 IMAGING — CR DG LUMBAR SPINE COMPLETE 4+V
5 series · 5 of 5 positions shown · non-contrast
Comparison: None.

CLINICAL DATA: Low back pain radiating into the left hip for 1
month.

EXAM:
LUMBAR SPINE - COMPLETE 4+ VIEW

[t lumbar spine ap]
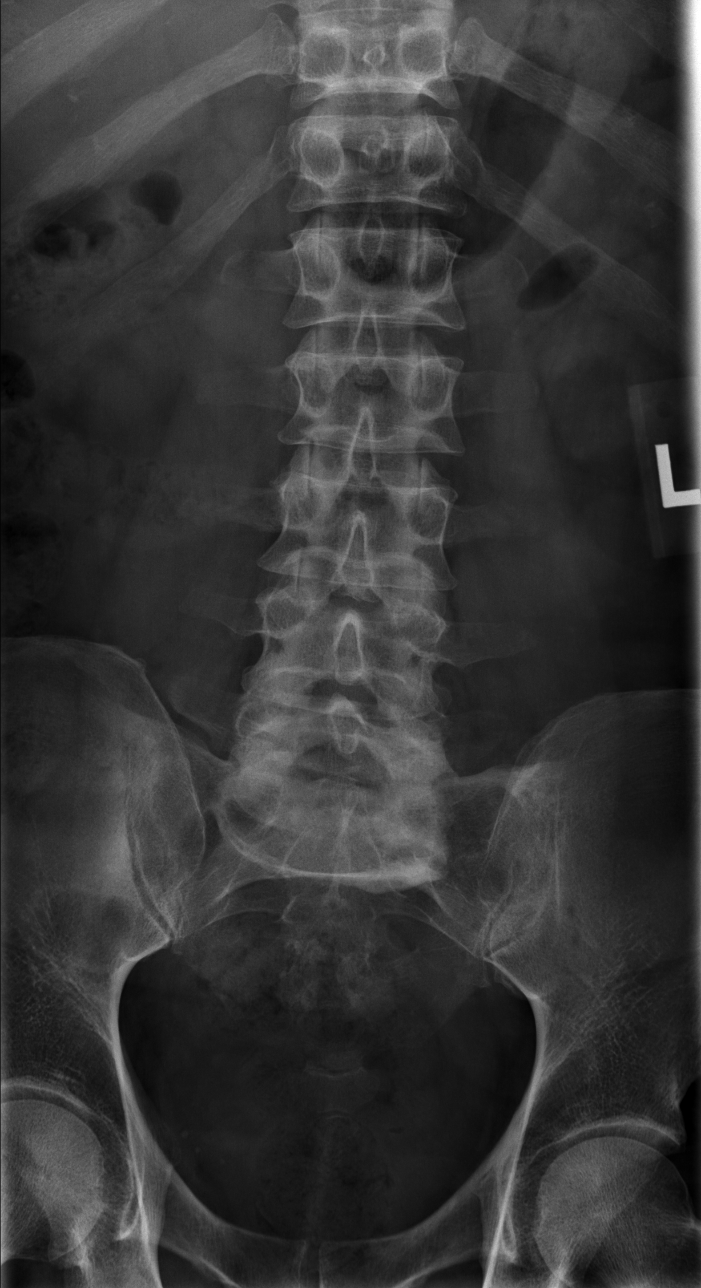

[t lumbar spine obl (1 of 2)]
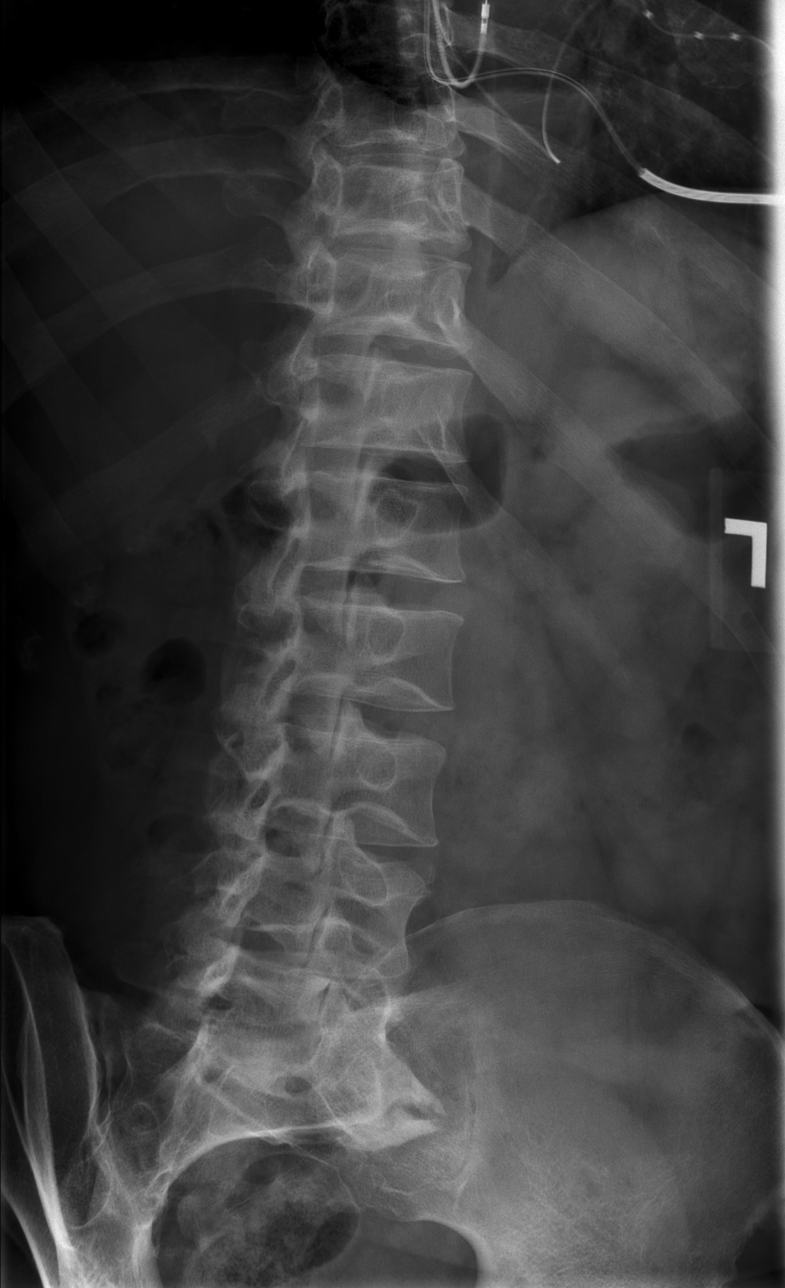

[t lumbar spine obl (2 of 2)]
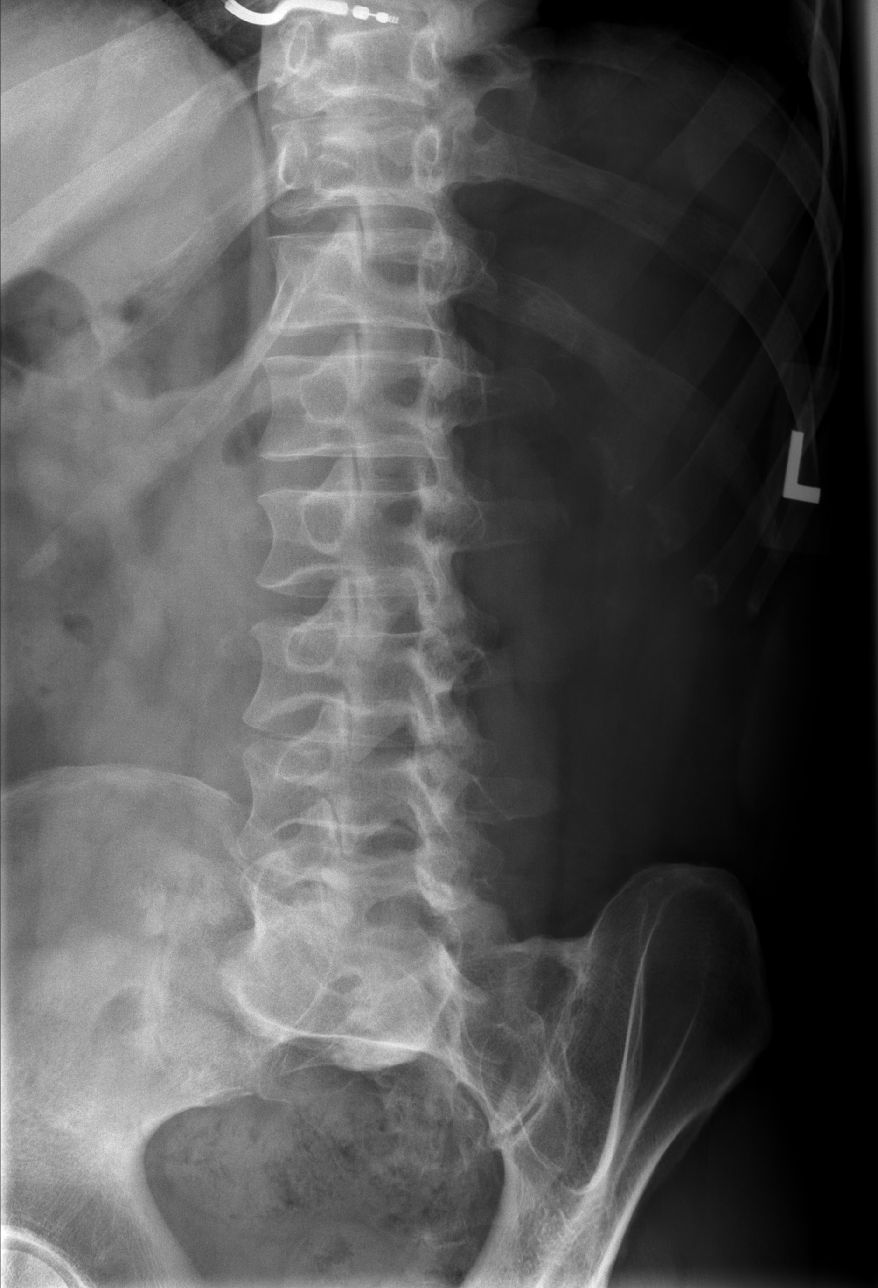

[t lumbar spine lat]
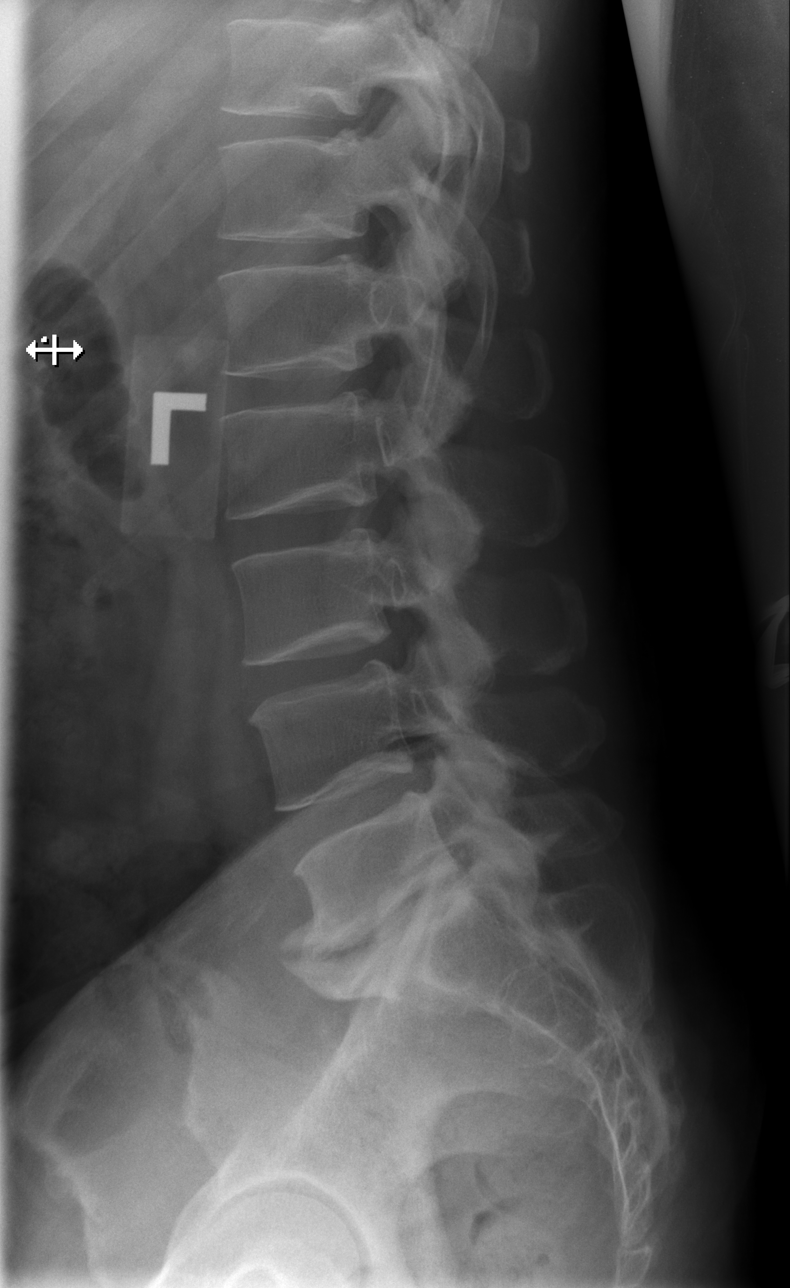

[t lumbar l-5 s-1 spot]
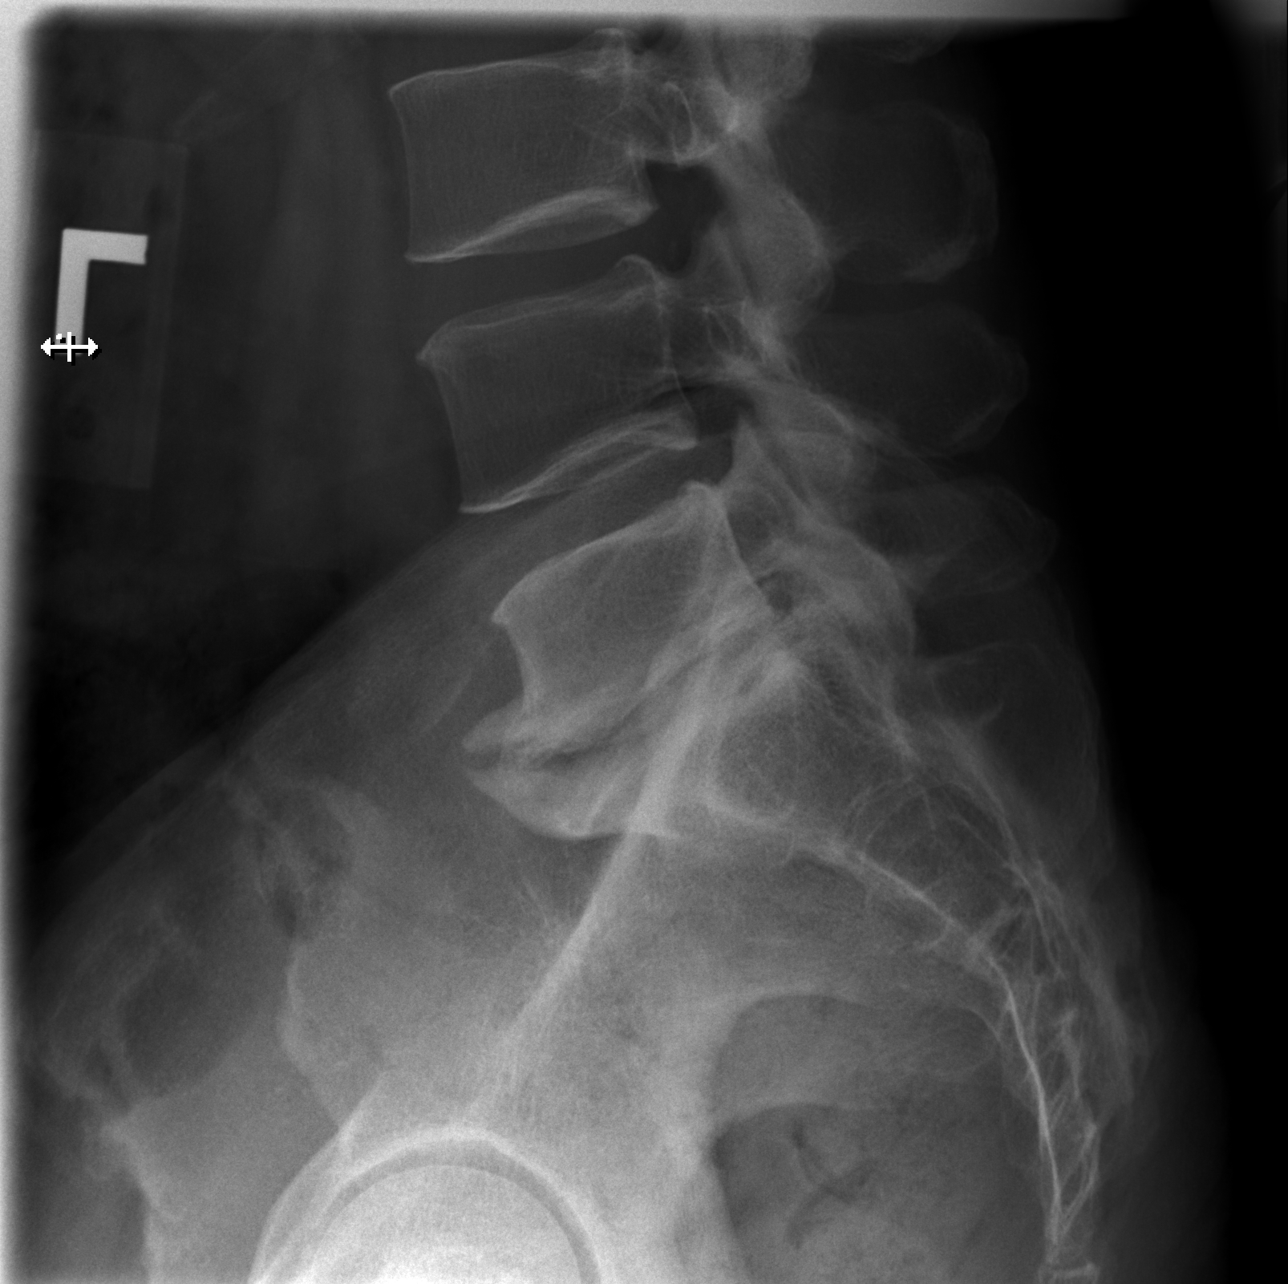

[5 of 5 positions shown; findings below may reference images not displayed]

FINDINGS: There is no fracture or malalignment. Marked loss of disc space
height and anterior endplate spurring are seen at L5-S1.
Intervertebral disc space height is otherwise maintained.
Paraspinous structures are unremarkable.
IMPRESSION: No acute abnormality.

Degenerative disc disease L5-S1.

## 2020-09-12 ENCOUNTER — Ambulatory Visit (INDEPENDENT_AMBULATORY_CARE_PROVIDER_SITE_OTHER): Payer: BC Managed Care – PPO

## 2020-09-12 DIAGNOSIS — I428 Other cardiomyopathies: Secondary | ICD-10-CM

## 2020-09-12 DIAGNOSIS — I5022 Chronic systolic (congestive) heart failure: Secondary | ICD-10-CM

## 2020-09-13 LAB — CUP PACEART REMOTE DEVICE CHECK
Battery Remaining Longevity: 41 mo
Battery Voltage: 2.96 V
Brady Statistic AP VP Percent: 0.65 %
Brady Statistic AP VS Percent: 0.01 %
Brady Statistic AS VP Percent: 99.3 %
Brady Statistic AS VS Percent: 0.04 %
Brady Statistic RA Percent Paced: 0.66 %
Brady Statistic RV Percent Paced: 99.77 %
Date Time Interrogation Session: 20211231001703
HighPow Impedance: 73 Ohm
Implantable Lead Implant Date: 20180328
Implantable Lead Implant Date: 20180328
Implantable Lead Implant Date: 20180328
Implantable Lead Location: 753858
Implantable Lead Location: 753859
Implantable Lead Location: 753860
Implantable Lead Model: 5076
Implantable Pulse Generator Implant Date: 20180328
Lead Channel Impedance Value: 208.568
Lead Channel Impedance Value: 220.723
Lead Channel Impedance Value: 223.149
Lead Channel Impedance Value: 231.878
Lead Channel Impedance Value: 237.12 Ohm
Lead Channel Impedance Value: 380 Ohm
Lead Channel Impedance Value: 399 Ohm
Lead Channel Impedance Value: 437 Ohm
Lead Channel Impedance Value: 437 Ohm
Lead Channel Impedance Value: 456 Ohm
Lead Channel Impedance Value: 494 Ohm
Lead Channel Impedance Value: 494 Ohm
Lead Channel Impedance Value: 665 Ohm
Lead Channel Impedance Value: 665 Ohm
Lead Channel Impedance Value: 722 Ohm
Lead Channel Impedance Value: 722 Ohm
Lead Channel Impedance Value: 817 Ohm
Lead Channel Impedance Value: 836 Ohm
Lead Channel Pacing Threshold Amplitude: 0.375 V
Lead Channel Pacing Threshold Amplitude: 0.75 V
Lead Channel Pacing Threshold Amplitude: 1.5 V
Lead Channel Pacing Threshold Pulse Width: 0.4 ms
Lead Channel Pacing Threshold Pulse Width: 0.4 ms
Lead Channel Pacing Threshold Pulse Width: 0.4 ms
Lead Channel Sensing Intrinsic Amplitude: 15.125 mV
Lead Channel Sensing Intrinsic Amplitude: 15.125 mV
Lead Channel Sensing Intrinsic Amplitude: 2.125 mV
Lead Channel Sensing Intrinsic Amplitude: 2.125 mV
Lead Channel Setting Pacing Amplitude: 1.5 V
Lead Channel Setting Pacing Amplitude: 2 V
Lead Channel Setting Pacing Amplitude: 2.5 V
Lead Channel Setting Pacing Pulse Width: 0.4 ms
Lead Channel Setting Pacing Pulse Width: 0.4 ms
Lead Channel Setting Sensing Sensitivity: 0.3 mV

## 2020-09-23 ENCOUNTER — Other Ambulatory Visit: Payer: Self-pay

## 2020-09-23 MED ORDER — LISINOPRIL 10 MG PO TABS: 10.0000 mg | ORAL_TABLET | Freq: Every day | ORAL | 0 refills | Status: DC

## 2020-09-26 NOTE — Progress Notes (Signed)
Remote ICD transmission.   

## 2020-10-29 ENCOUNTER — Other Ambulatory Visit: Payer: Self-pay | Admitting: Physician Assistant

## 2020-10-29 ENCOUNTER — Other Ambulatory Visit: Payer: Self-pay | Admitting: Interventional Cardiology

## 2020-10-30 ENCOUNTER — Telehealth: Payer: Self-pay | Admitting: Interventional Cardiology

## 2020-10-30 MED ORDER — LISINOPRIL 10 MG PO TABS: 10.0000 mg | ORAL_TABLET | Freq: Every day | ORAL | 0 refills | Status: DC

## 2020-10-30 NOTE — Telephone Encounter (Signed)
Pt's medication was sent to pt's pharmacy as requested. Confirmation received.  °

## 2020-10-30 NOTE — Telephone Encounter (Signed)
*  STAT* If patient is at the pharmacy, call can be transferred to refill team.   1. Which medications need to be refilled? (please list name of each medication and dose if known) lisinopril (ZESTRIL) 10 MG tablet  2. Which pharmacy/location (including street and city if local pharmacy) is medication to be sent to? [id number] - [place], [place] - [address]  3. Do they need a 30 day or 90 day supply?   Patient scheduled an appointment for 12/16/20 with Ronie Spies as this was the soonest available with Dr. Andreas Blower APP. Patient is requesting a supply of Lisinopril that will last him until this appointment. He states he has been completely out of it for 3 days. Please assist.

## 2020-12-12 ENCOUNTER — Ambulatory Visit (INDEPENDENT_AMBULATORY_CARE_PROVIDER_SITE_OTHER): Payer: BC Managed Care – PPO

## 2020-12-12 DIAGNOSIS — I428 Other cardiomyopathies: Secondary | ICD-10-CM

## 2020-12-12 LAB — CUP PACEART REMOTE DEVICE CHECK
Battery Remaining Longevity: 39 mo
Battery Voltage: 2.96 V
Brady Statistic AP VP Percent: 0.29 %
Brady Statistic AP VS Percent: 0.01 %
Brady Statistic AS VP Percent: 99.66 %
Brady Statistic AS VS Percent: 0.03 %
Brady Statistic RA Percent Paced: 0.31 %
Brady Statistic RV Percent Paced: 99.83 %
Date Time Interrogation Session: 20220331002204
HighPow Impedance: 67 Ohm
Implantable Lead Implant Date: 20180328
Implantable Lead Implant Date: 20180328
Implantable Lead Implant Date: 20180328
Implantable Lead Location: 753858
Implantable Lead Location: 753859
Implantable Lead Location: 753860
Implantable Lead Model: 5076
Implantable Pulse Generator Implant Date: 20180328
Lead Channel Impedance Value: 194.634
Lead Channel Impedance Value: 208.568
Lead Channel Impedance Value: 218.298
Lead Channel Impedance Value: 224.438
Lead Channel Impedance Value: 235.98 Ohm
Lead Channel Impedance Value: 342 Ohm
Lead Channel Impedance Value: 380 Ohm
Lead Channel Impedance Value: 399 Ohm
Lead Channel Impedance Value: 399 Ohm
Lead Channel Impedance Value: 437 Ohm
Lead Channel Impedance Value: 456 Ohm
Lead Channel Impedance Value: 513 Ohm
Lead Channel Impedance Value: 646 Ohm
Lead Channel Impedance Value: 703 Ohm
Lead Channel Impedance Value: 703 Ohm
Lead Channel Impedance Value: 779 Ohm
Lead Channel Impedance Value: 779 Ohm
Lead Channel Impedance Value: 817 Ohm
Lead Channel Pacing Threshold Amplitude: 0.375 V
Lead Channel Pacing Threshold Amplitude: 0.75 V
Lead Channel Pacing Threshold Amplitude: 1.75 V
Lead Channel Pacing Threshold Pulse Width: 0.4 ms
Lead Channel Pacing Threshold Pulse Width: 0.4 ms
Lead Channel Pacing Threshold Pulse Width: 0.4 ms
Lead Channel Sensing Intrinsic Amplitude: 12.75 mV
Lead Channel Sensing Intrinsic Amplitude: 12.75 mV
Lead Channel Sensing Intrinsic Amplitude: 2.75 mV
Lead Channel Sensing Intrinsic Amplitude: 2.75 mV
Lead Channel Setting Pacing Amplitude: 1.5 V
Lead Channel Setting Pacing Amplitude: 2.5 V
Lead Channel Setting Pacing Amplitude: 2.75 V
Lead Channel Setting Pacing Pulse Width: 0.4 ms
Lead Channel Setting Pacing Pulse Width: 0.4 ms
Lead Channel Setting Sensing Sensitivity: 0.3 mV

## 2020-12-14 ENCOUNTER — Encounter: Payer: Self-pay | Admitting: Physician Assistant

## 2020-12-14 NOTE — Progress Notes (Addendum)
Cardiology Office Note    Date:  12/16/2020   ID:  Jeremy Wagner, DOB 1961-04-03, MRN [id number]  PCP:  Georgianne Fick, MD  Cardiologist:  Lesleigh Noe, MD  Electrophysiologist:  Sherryl Manges, MD   Chief Complaint: f/u CHF  History of Present Illness:   Jeremy Wagner is a 60 y.o. male with history of HTN, NICM, LBBB, syncope 2018, CRT-D implantation 11/2016, anxiety who presents for routine general cardiology follow-up (last 08/2019, with EP f/u 09/2019). He has a longstanding history of cardiomyopathy with reported normal coronaries with EF 35-40% by cath in 01/2008. I do not have a copy of this report. Dr. Michaelle Copas notes reference h/o nonobstructive CAD. No cardiac cath or coronary CTA on file by Epic aside from the cath from 2009. Last echo 11/2016 showed EF 20-25%, grade 1 DD, + WMA, mild LAE. He had Medtronic CRT-D implanted at that time. Last stress test 07/2017 showed fixed small apical perfusion defect, cannot r/o prior infarction, no ischemia, EF 38%, diffuse HK. Cost was an issue with Entresto in the past. He also recalls it made him feel "bad." SVT and NSVT were mentioned in previous EP notes. There was a brief episode of SVT <3 minutes on interrogation 09/2018 but otherwise no events reported on interrogations. He also has a history of atypical chest pain.  He is seen back for follow-up doing well and denies any chest pain, SOB, orthopnea, PND, edema, palpitations or ICD discharge. 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 He has no complaints today. States he had labs 2 weeks ago by PCP but not showing up in [facility].  ADDENDUM: Labs reviewed from PCP 12/02/20 with Na 140, K 4.6, Cr 1.23, BUN 10, albumin 4.2, normal AST/ALT, LDL 70, trig 63,   Labwork independently reviewed: 04/2020 LDL 65, trig 86, TSH wnl [facility] 09/2019 Mg 2.1 K 3.8 Cr 0.90 (previous range up to 1.33) 2018 Hgb 12.1, TSH wnl   Past Medical History:   Diagnosis Date  . Anxiety   . Biventricular ICD (implantable cardioverter-defibrillator) in place   . Chronic combined systolic and diastolic CHF (congestive heart failure) (HCC)   . GERD (gastroesophageal reflux disease)   . Heart murmur   . Hematuria   . Hypertension   . Insomnia   . LBBB (left bundle branch block)   . Nonischemic cardiomyopathy (HCC)    a. LVEF 35-40% by cath 01-13-08. Normal coronary arteries. b. 2D echo 2016: EF 30-35%. c. 2D echo 2018 EF 20-25%.    Past Surgical History:  Procedure Laterality Date  . BIV ICD INSERTION CRT-D N/A 12/09/2016   Procedure: BiV ICD Insertion CRT-D;  Surgeon: Duke Salvia, MD;  [facility] INVASIVE CV LAB;  Service: Cardiovascular;  Laterality: N/A;  . CARDIAC CATHETERIZATION  01/2008   clean Coronaries, HTN CM??; EF ~30-35%    Current Medications: Current Meds  Medication Sig  . aspirin EC 81 MG tablet Take 1 tablet (81 mg total) by mouth daily.  Marland Kitchen atorvastatin (LIPITOR) 10 MG tablet Take 10 mg by mouth daily.  . carvedilol (COREG) 12.5 MG tablet Take 1 tablet (12.5 mg total) by mouth 2 (two) times daily with a meal. Please make overdue appt with Dr. Graciela Husbands before anymore refills. Thank you 1st attempt  . lisinopril (ZESTRIL) 10 MG tablet Take 1 tablet (10 mg total) by mouth daily. Please keep upcoming appt in April 2022 before anymore refills. Thank you Final  Attempt  . nitroGLYCERIN (NITROSTAT) 0.4 MG SL tablet PLACE 1 TABLET (0.4 MG TOTAL) UNDER THE TONGUE EVERY 5 (FIVE) MINUTES AS NEEDED FOR CHEST PAIN.    Allergies:   Patient has no known allergies.   Social History   Socioeconomic History  . Marital status: Married    Spouse name: Not on file  . Number of children: Not on file  . Years of education: Not on file  . Highest education level: Not on file  Occupational History  . Not on file  Tobacco Use  . Smoking status: Never Smoker  . Smokeless tobacco: Never Used  Vaping Use  . Vaping Use: Never used  Substance  and Sexual Activity  . Alcohol use: No    Alcohol/week: 0.0 standard drinks  . Drug use: No  . Sexual activity: Not on file  Other Topics Concern  . Not on file  Social History Narrative   Works for the city   Physically very active job.   Has 2 summer jobs both of which are very physical   [place] a lot   Drinks ~ 24 Oz of caffeine in am   3 kids   Married   Lives in [place] [employer]   Never smoker   Social Determinants of Health   Financial Resource Strain: Not on file  Food Insecurity: Not on file  Transportation Needs: Not on file  Physical Activity: Not on file  Stress: Not on file  Social Connections: Not on file     Family History:  The patient's family history includes CAD (age of onset: 4) in his paternal aunt; Coronary artery disease (age of onset: 20) in his paternal uncle; Hypertension in his father and mother.  ROS:   Please see the history of present illness.  All other systems are reviewed and otherwise negative.    EKGs/Labs/Other Studies Reviewed:    Studies reviewed are outlined and summarized above. Reports included below if pertinent.  2D echo 2018 - Left ventricle: The cavity size was normal. Wall thickness was  increased in a pattern of mild LVH. Systolic function was  severely reduced. The estimated ejection fraction was in the  range of 20% to 25%. Diffuse hypokinesis. Doppler parameters are  consistent with abnormal left ventricular relaxation (grade 1  diastolic dysfunction).  - Regional wall motion abnormality: Akinesis of the mid anterior  and basal-mid anteroseptal myocardium.  - Aortic valve: Mildly calcified annulus. Trileaflet; normal  thickness leaflets. Valve area (VTI): 1.69 cm^2. Valve area  (Vmax): 1.93 cm^2. Valve area (Vmean): 1.83 cm^2.  - Left atrium: The atrium was mildly dilated.  - Atrial septum: No defect or patent foramen ovale was identified.  - Technically adequate study.     EKG:  EKG is  ordered today, personally reviewed, demonstrating SB 53bpm, BiV pacing with generally low voltage, nonspecific changes with no acute change from prior  Recent Labs: No results found for requested labs within last 8760 hours.  Recent Lipid Panel No results found for: CHOL, TRIG, HDL, CHOLHDL, VLDL, LDLCALC, LDLDIRECT  PHYSICAL EXAM:    VS:  BP 128/80   Pulse (!) 53   Ht 5\' 11"  (1.803 m)   Wt 180 lb 6.4 oz (81.8 kg)   BMI 25.16 kg/m   BMI: Body mass index is 25.16 kg/m.  GEN: Well nourished, well developed male in no acute distress HEENT: normocephalic, atraumatic Neck: no JVD, carotid bruits, or masses Cardiac: RRR; no murmurs, rubs, or gallops,  no edema  Respiratory:  clear to auscultation bilaterally, normal work of breathing GI: soft, nontender, nondistended, + BS MS: no deformity or atrophy Skin: warm and dry, no rash Neuro:  Alert and Oriented x 3, Strength and sensation are intact, follows commands Psych: euthymic mood, full affect  Wt Readings from Last 3 Encounters:  12/16/20 180 lb 6.4 oz (81.8 kg)  10/13/19 177 lb (80.3 kg)  09/14/19 177 lb (80.3 kg)     ASSESSMENT & PLAN:   1. Chronic combined CHF - appears euvolemic on exam. Essentially NYHA class 1 at this time. Remains on ACEi and BB. Reports Entresto made him feel poorly and cost was also an issue. I see in prior EP notes that Dr. Graciela Husbands was to speak with Dr. [name] about aldactone but do not see this has been prescribed in the past. He has not had a repeat echocardiogram since his BiV-ICD was placed. We'll go ahead and repeat this. If EF has normalized will plan to hold course with regimen but if EF remains decreased, will consider addition of spironolactone as long as his most recent labs were stable. Will see if we can obtain a copy of these and if not will plan to get CBC, CMET. TSH and lipid profile were reviewed from 04/2020. HR is in the 50s on carvedilol and he feels well. Reviewed 2g sodium restriction, 2L  fluid restriction, daily weights with patient.  2. Non-ischemic cardiomyopathy - previously mentioned as possibly due to HTN in remote notes. Cath in 2009 reported to have shown no evidence of CAD. Dr. Michaelle Copas notes reference h/o nonobstructive CAD but I cannot find any studies outlining this. Question to what degree LBBB was playing a role. He is not having any angina. He requests refill on SL NTG PRN (has not had to use this at all in recent years). See plan as outlined above. F/u echocardiogram.   3. Essential HTN - well controlled. Await labs for review.  4. History of ICD - last EP followup was in 09/2019. Denies device discharge. Will request for him to get back on track for EP follow-up. Ideally in the future we could consider doing gen cards and EP staggered every 6 months but for now given that he is overdue, we must request he get back on track to see them as well.  Disposition: F/u with Dr. [name] in 1 year (previously seen annually). Also plan to f/u EP as well.  Medication Adjustments/Labs and Tests Ordered: Current medicines are reviewed at length with the patient today.  Concerns regarding medicines are outlined above. Medication changes, Labs and Tests ordered today are summarized above and listed in the Patient Instructions accessible in Encounters.   Signed, Laurann Montana, PA-C  12/16/2020 8:19 AM    [facility] [address] [place], [place], [place]  [address] Phone: [phone]; Fax: [phone]

## 2020-12-16 ENCOUNTER — Encounter: Payer: Self-pay | Admitting: Physician Assistant

## 2020-12-16 ENCOUNTER — Ambulatory Visit: Payer: BC Managed Care – PPO | Admitting: Physician Assistant

## 2020-12-16 ENCOUNTER — Other Ambulatory Visit: Payer: Self-pay

## 2020-12-16 VITALS — BP 128/80 | HR 53 | Ht 71.0 in | Wt 180.4 lb

## 2020-12-16 DIAGNOSIS — I10 Essential (primary) hypertension: Secondary | ICD-10-CM | POA: Diagnosis not present

## 2020-12-16 DIAGNOSIS — I428 Other cardiomyopathies: Secondary | ICD-10-CM | POA: Diagnosis not present

## 2020-12-16 DIAGNOSIS — Z95810 Presence of automatic (implantable) cardiac defibrillator: Secondary | ICD-10-CM | POA: Diagnosis not present

## 2020-12-16 DIAGNOSIS — I5042 Chronic combined systolic (congestive) and diastolic (congestive) heart failure: Secondary | ICD-10-CM

## 2020-12-16 MED ORDER — NITROGLYCERIN 0.4 MG SL SUBL: 0.4000 mg | SUBLINGUAL_TABLET | SUBLINGUAL | 3 refills | Status: DC | PRN

## 2020-12-16 NOTE — Patient Instructions (Addendum)
Medication Instructions:  Your physician recommends that you continue on your current medications as directed. Please refer to the Current Medication list given to you today.  *If you need a refill on your cardiac medications before your next appointment, please call your pharmacy*   Lab Work: None ordered  If you have labs (blood work) drawn today and your tests are completely normal, you will receive your results only by: Marland Kitchen MyChart Message (if you have MyChart) OR . A paper copy in the mail If you have any lab test that is abnormal or we need to change your treatment, we will call you to review the results.   Testing/Procedures: Your physician has requested that you have an echocardiogram. Echocardiography is a painless test that uses sound waves to create images of your heart. It provides your doctor with information about the size and shape of your heart and how well your heart's chambers and valves are working. This procedure takes approximately one hour. There are no restrictions for this procedure. ]    Follow-Up: At [facility], you and your health needs are our priority.  As part of our continuing mission to provide you with exceptional heart care, we have created designated Provider Care Teams.  These Care Teams include your primary Cardiologist (physician) and Advanced Practice Providers (APPs -  Physician Assistants and Nurse Practitioners) who all work together to provide you with the care you need, when you need it.  We recommend signing up for the patient portal called "MyChart".  Sign up information is provided on this After Visit Summary.  MyChart is used to connect with patients for Virtual Visits (Telemedicine).  Patients are able to view lab/test results, encounter notes, upcoming appointments, etc.  Non-urgent messages can be sent to your provider as well.   To learn more about what you can do with MyChart, go to [email or web address].    Your next appointment:    12 month(s)  The format for your next appointment:   In Person  Provider:   You may see Lesleigh Noe, MD or one of the following Advanced Practice Providers on your designated Care Team:    Georgie Chard, NP  You are overdue to see Dr. Graciela Husbands.Marland Kitchen His scheduler will call you to schedule that follow-up appointment.   Other Instructions  For patients with history of congestive heart failure, we give them these special instructions:  1. Follow a low-salt diet - you are allowed no more than 2,000mg  of sodium per day. Watch your fluid intake. In general, you should not be taking in more than 2 liters of fluid per day (close to 64 oz of fluid per day). This includes sources of water in foods like soup, coffee, tea, milk, etc. 2. Weigh yourself on the same scale at same time of day and keep a log. 3. Call your doctor: (Anytime you feel any of the following symptoms)  - 3lb weight gain overnight or 5lb within a few days - Shortness of breath, with or without a dry hacking cough  - Swelling in the hands, feet or stomach  - If you have to sleep on extra pillows at night in order to breathe   IT IS IMPORTANT TO LET YOUR DOCTOR KNOW EARLY ON IF YOU ARE HAVING SYMPTOMS SO WE CAN HELP YOU!

## 2020-12-23 NOTE — Progress Notes (Signed)
Electrophysiology Office Note Date: 12/24/2020  ID:  Jeremy Wagner, DOB 1961/07/05, MRN [id number]  PCP: Jeremy Fick, MD Primary Cardiologist: Jeremy Noe, MD Electrophysiologist: Sherryl Manges, MD   CC: Routine ICD follow-up  Jeremy Wagner is a 60 y.o. male seen today for Sherryl Manges, MD for routine electrophysiology followup.  Since last being seen in our clinic the patient reports doing very well. 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  he denies chest pain, palpitations, dyspnea, PND, orthopnea, nausea, vomiting, dizziness, syncope, edema, weight gain, or early satiety. He has not had ICD shocks.   Device History: Medtronic BiV ICD implanted 12/09/2016 for NICM  History of appropriate therapy: No   Past Medical History:  Diagnosis Date  . Anxiety   . Biventricular ICD (implantable cardioverter-defibrillator) in place   . Chronic combined systolic and diastolic CHF (congestive heart failure) (HCC)   . GERD (gastroesophageal reflux disease)   . Heart murmur   . Hematuria   . Hypertension   . Insomnia   . LBBB (left bundle branch block)   . Nonischemic cardiomyopathy (HCC)    a. LVEF 35-40% by cath 01-13-08. Normal coronary arteries. b. 2D echo 2016: EF 30-35%. c. 2D echo 2018 EF 20-25%.   Past Surgical History:  Procedure Laterality Date  . BIV ICD INSERTION CRT-D N/A 12/09/2016   Procedure: BiV ICD Insertion CRT-D;  Surgeon: Jeremy Salvia, MD;  Location: [facility] INVASIVE CV LAB;  Service: Cardiovascular;  Laterality: N/A;  . CARDIAC CATHETERIZATION  01/2008   clean Coronaries, HTN CM??; EF ~30-35%    Current Outpatient Medications  Medication Sig Dispense Refill  . aspirin EC 81 MG tablet Take 1 tablet (81 mg total) by mouth daily. 30 tablet 0  . atorvastatin (LIPITOR) 10 MG tablet Take 10 mg by mouth daily.    . carvedilol (COREG) 12.5 MG tablet Take 1 tablet (12.5 mg total) by mouth 2 (two) times daily with a meal. Please make overdue appt with  Dr. Graciela Husbands before anymore refills. Thank you 1st attempt 60 tablet 0  . lisinopril (ZESTRIL) 10 MG tablet Take 1 tablet (10 mg total) by mouth daily. Please keep upcoming appt in April 2022 before anymore refills. Thank you Final Attempt 90 tablet 0  . nitroGLYCERIN (NITROSTAT) 0.4 MG SL tablet Place 1 tablet (0.4 mg total) under the tongue every 5 (five) minutes as needed for chest pain (up to 3 doses). 25 tablet 3   No current facility-administered medications for this visit.    Allergies:   Patient has no known allergies.   Social History: Social History   Socioeconomic History  . Marital status: Married    Spouse name: Not on file  . Number of children: Not on file  . Years of education: Not on file  . Highest education level: Not on file  Occupational History  . Not on file  Tobacco Use  . Smoking status: Never Smoker  . Smokeless tobacco: Never Used  Vaping Use  . Vaping Use: Never used  Substance and Sexual Activity  . Alcohol use: No    Alcohol/week: 0.0 standard drinks  . Drug use: No  . Sexual activity: Not on file  Other Topics Concern  . Not on file  Social History Narrative   Works for the city   Physically very active job.   Has 2 summer jobs both of which are very physical   [place] a lot   Drinks ~ 24 Oz of  caffeine in am   3 kids   Married   Lives in [place]   Never smoker   Social Determinants of Health   Financial Resource Strain: Not on file  Food Insecurity: Not on file  Transportation Needs: Not on file  Physical Activity: Not on file  Stress: Not on file  Social Connections: Not on file  Intimate Partner Violence: Not on file    Family History: Family History  Problem Relation Age of Onset  . Hypertension Mother   . Hypertension Father   . CAD Paternal Aunt 66  . Coronary artery disease Paternal Uncle 60    Review of Systems: All other systems reviewed and are otherwise negative except as noted above.   Physical  Exam: Vitals:   12/24/20 0933  BP: 120/78  Pulse: 67  SpO2: 96%  Weight: 177 lb (80.3 kg)  Height: 5\' 11"  (1.803 m)     GEN- The patient is well appearing, alert and oriented x 3 today.   HEENT: normocephalic, atraumatic; sclera clear, conjunctiva pink; hearing intact; oropharynx clear; neck supple, no JVP Lymph- no cervical lymphadenopathy Lungs- Clear to ausculation bilaterally, normal work of breathing.  No wheezes, rales, rhonchi Heart- Regular rate and rhythm, no murmurs, rubs or gallops, PMI not laterally displaced GI- soft, non-tender, non-distended, bowel sounds present, no hepatosplenomegaly Extremities- no clubbing or cyanosis. No edema; DP/PT/radial pulses 2+ bilaterally MS- no significant deformity or atrophy Skin- warm and dry, no rash or lesion; ICD pocket well healed Psych- euthymic mood, full affect Neuro- strength and sensation are intact  ICD interrogation- reviewed in detail today,  See PACEART report  EKG:  EKG is not ordered today. The ekg ordered today shows Sinus brady at 53/BiV pacing with QRS of 144 ms with downward Lead 1 and RS V1.  Recent Labs: No results found for requested labs within last 8760 hours.   Wt Readings from Last 3 Encounters:  12/24/20 177 lb (80.3 kg)  12/16/20 180 lb 6.4 oz (81.8 kg)  10/13/19 177 lb (80.3 kg)     Other studies Reviewed: Additional studies/ records that were reviewed today include: Previous EP and cards notes   Assessment and Plan:  1.  Chronic systolic dysfunction s/p Medtronic CRT-D  euvolemic today Stable on an appropriate medical regimen Normal ICD function See Pace Art report No changes today  2. Chronic systolic CHF, NICM Follows with gen cards.  Repeat echo pending Agree with considering spiro based on echo/labs.  Continue BB Continue lisinopril (He tolerated Entresto poorly - non specific symptoms, and cost was also an issue)  3. NSVT Known. Stable and asymptomatic.  On BB. Repeat echo  pending.   Current medicines are reviewed at length with the patient today.   The patient does not have concerns regarding his medicines.  The following changes were made today: None  Labs/ tests ordered today include:  No orders of the defined types were placed in this encounter.  Disposition:   Follow up with EP APP or Dr. 10/15/19 in 6 months.   Graciela Husbands, PA-C  12/24/2020 9:48 AM  [facility] [address] [place] [phone] (office) [phone] (fax)

## 2020-12-24 ENCOUNTER — Encounter: Payer: Self-pay | Admitting: Student

## 2020-12-24 ENCOUNTER — Other Ambulatory Visit: Payer: Self-pay

## 2020-12-24 ENCOUNTER — Ambulatory Visit: Payer: BC Managed Care – PPO | Admitting: Student

## 2020-12-24 VITALS — BP 120/78 | HR 67 | Ht 71.0 in | Wt 177.0 lb

## 2020-12-24 DIAGNOSIS — I5042 Chronic combined systolic (congestive) and diastolic (congestive) heart failure: Secondary | ICD-10-CM

## 2020-12-24 DIAGNOSIS — I428 Other cardiomyopathies: Secondary | ICD-10-CM

## 2020-12-24 DIAGNOSIS — I10 Essential (primary) hypertension: Secondary | ICD-10-CM | POA: Diagnosis not present

## 2020-12-24 DIAGNOSIS — I472 Ventricular tachycardia: Secondary | ICD-10-CM | POA: Diagnosis not present

## 2020-12-24 DIAGNOSIS — I4729 Other ventricular tachycardia: Secondary | ICD-10-CM

## 2020-12-24 NOTE — Patient Instructions (Signed)
Medication Instructions:  Your physician recommends that you continue on your current medications as directed. Please refer to the Current Medication list given to you today.  *If you need a refill on your cardiac medications before your next appointment, please call your pharmacy*   Lab Work: None If you have labs (blood work) drawn today and your tests are completely normal, you will receive your results only by: Marland Kitchen MyChart Message (if you have MyChart) OR . A paper copy in the mail If you have any lab test that is abnormal or we need to change your treatment, we will call you to review the results.  Follow-Up: At [facility], you and your health needs are our priority.  As part of our continuing mission to provide you with exceptional heart care, we have created designated Provider Care Teams.  These Care Teams include your primary Cardiologist (physician) and Advanced Practice Providers (APPs -  Physician Assistants and Nurse Practitioners) who all work together to provide you with the care you need, when you need it.  We recommend signing up for the patient portal called "MyChart".  Sign up information is provided on this After Visit Summary.  MyChart is used to connect with patients for Virtual Visits (Telemedicine).  Patients are able to view lab/test results, encounter notes, upcoming appointments, etc.  Non-urgent messages can be sent to your provider as well.   To learn more about what you can do with MyChart, go to [email or web address].    Your next appointment:   6 month(s)  The format for your next appointment:   In Person  Provider:   You may see Sherryl Manges, MD or one of the following Advanced Practice Providers on your designated Care Team:     Casimiro Needle "Mardelle Matte" [place], [place]

## 2020-12-25 NOTE — Progress Notes (Signed)
Remote ICD transmission.   

## 2021-01-16 ENCOUNTER — Ambulatory Visit (HOSPITAL_COMMUNITY): Payer: BC Managed Care – PPO | Attending: Internal Medicine

## 2021-01-16 ENCOUNTER — Other Ambulatory Visit: Payer: Self-pay

## 2021-01-16 DIAGNOSIS — I428 Other cardiomyopathies: Secondary | ICD-10-CM | POA: Diagnosis not present

## 2021-01-16 LAB — ECHOCARDIOGRAM COMPLETE
Area-P 1/2: 3.03 cm2
S' Lateral: 3.6 cm

## 2021-01-18 ENCOUNTER — Other Ambulatory Visit: Payer: Self-pay | Admitting: Interventional Cardiology

## 2021-02-13 ENCOUNTER — Other Ambulatory Visit: Payer: Self-pay | Admitting: Internal Medicine

## 2021-03-13 ENCOUNTER — Ambulatory Visit (INDEPENDENT_AMBULATORY_CARE_PROVIDER_SITE_OTHER): Payer: BC Managed Care – PPO

## 2021-03-13 DIAGNOSIS — I428 Other cardiomyopathies: Secondary | ICD-10-CM | POA: Diagnosis not present

## 2021-03-13 LAB — CUP PACEART REMOTE DEVICE CHECK
Battery Remaining Longevity: 34 mo
Battery Voltage: 2.95 V
Brady Statistic AP VP Percent: 0.67 %
Brady Statistic AP VS Percent: 0.01 %
Brady Statistic AS VP Percent: 99.28 %
Brady Statistic AS VS Percent: 0.04 %
Brady Statistic RA Percent Paced: 0.68 %
Brady Statistic RV Percent Paced: 99.89 %
Date Time Interrogation Session: 20220630012205
HighPow Impedance: 70 Ohm
Implantable Lead Implant Date: 20180328
Implantable Lead Implant Date: 20180328
Implantable Lead Implant Date: 20180328
Implantable Lead Location: 753858
Implantable Lead Location: 753859
Implantable Lead Location: 753860
Implantable Lead Model: 5076
Implantable Pulse Generator Implant Date: 20180328
Lead Channel Impedance Value: 208.568
Lead Channel Impedance Value: 208.568
Lead Channel Impedance Value: 228 Ohm
Lead Channel Impedance Value: 228 Ohm
Lead Channel Impedance Value: 239.922
Lead Channel Impedance Value: 304 Ohm
Lead Channel Impedance Value: 399 Ohm
Lead Channel Impedance Value: 399 Ohm
Lead Channel Impedance Value: 437 Ohm
Lead Channel Impedance Value: 437 Ohm
Lead Channel Impedance Value: 494 Ohm
Lead Channel Impedance Value: 532 Ohm
Lead Channel Impedance Value: 646 Ohm
Lead Channel Impedance Value: 665 Ohm
Lead Channel Impedance Value: 665 Ohm
Lead Channel Impedance Value: 779 Ohm
Lead Channel Impedance Value: 836 Ohm
Lead Channel Impedance Value: 836 Ohm
Lead Channel Pacing Threshold Amplitude: 0.375 V
Lead Channel Pacing Threshold Amplitude: 0.75 V
Lead Channel Pacing Threshold Amplitude: 1.75 V
Lead Channel Pacing Threshold Pulse Width: 0.4 ms
Lead Channel Pacing Threshold Pulse Width: 0.4 ms
Lead Channel Pacing Threshold Pulse Width: 0.4 ms
Lead Channel Sensing Intrinsic Amplitude: 11.625 mV
Lead Channel Sensing Intrinsic Amplitude: 11.625 mV
Lead Channel Sensing Intrinsic Amplitude: 2 mV
Lead Channel Sensing Intrinsic Amplitude: 2 mV
Lead Channel Setting Pacing Amplitude: 1.5 V
Lead Channel Setting Pacing Amplitude: 2.5 V
Lead Channel Setting Pacing Amplitude: 2.75 V
Lead Channel Setting Pacing Pulse Width: 0.4 ms
Lead Channel Setting Pacing Pulse Width: 0.4 ms
Lead Channel Setting Sensing Sensitivity: 0.3 mV

## 2021-04-02 ENCOUNTER — Telehealth: Payer: Self-pay | Admitting: Interventional Cardiology

## 2021-04-02 NOTE — Progress Notes (Signed)
Remote ICD transmission.   

## 2021-04-02 NOTE — Telephone Encounter (Signed)
Successful telephone call to patient to follow up on needed letter for air travel. Discuss with patient that he should have received official Medtronic device wallet card soon after implant that should be used during air travel security clearance. After checking walled, patient located card. He was instructed to show at all security checks and to not allow wanding over device. Appreciative of follow up.

## 2021-04-02 NOTE — Telephone Encounter (Signed)
Patient called in to say that on Aug 10th he will be going on a flight to [place] and he needs a letter saying that he has a defibrillator and that he needs to be search with the handheld device. Please advise

## 2021-06-12 ENCOUNTER — Ambulatory Visit (INDEPENDENT_AMBULATORY_CARE_PROVIDER_SITE_OTHER): Payer: BC Managed Care – PPO

## 2021-06-12 DIAGNOSIS — I428 Other cardiomyopathies: Secondary | ICD-10-CM

## 2021-06-12 LAB — CUP PACEART REMOTE DEVICE CHECK
Battery Remaining Longevity: 30 mo
Battery Voltage: 2.95 V
Brady Statistic AP VP Percent: 0.84 %
Brady Statistic AP VS Percent: 0.01 %
Brady Statistic AS VP Percent: 99.11 %
Brady Statistic AS VS Percent: 0.04 %
Brady Statistic RA Percent Paced: 0.85 %
Brady Statistic RV Percent Paced: 99.89 %
Date Time Interrogation Session: 20220929022826
HighPow Impedance: 68 Ohm
Implantable Lead Implant Date: 20180328
Implantable Lead Implant Date: 20180328
Implantable Lead Implant Date: 20180328
Implantable Lead Location: 753858
Implantable Lead Location: 753859
Implantable Lead Location: 753860
Implantable Lead Model: 5076
Implantable Pulse Generator Implant Date: 20180328
Lead Channel Impedance Value: 212.8 Ohm
Lead Channel Impedance Value: 220.723
Lead Channel Impedance Value: 228 Ohm
Lead Channel Impedance Value: 237.12 Ohm
Lead Channel Impedance Value: 237.12 Ohm
Lead Channel Impedance Value: 342 Ohm
Lead Channel Impedance Value: 399 Ohm
Lead Channel Impedance Value: 399 Ohm
Lead Channel Impedance Value: 437 Ohm
Lead Channel Impedance Value: 456 Ohm
Lead Channel Impedance Value: 456 Ohm
Lead Channel Impedance Value: 494 Ohm
Lead Channel Impedance Value: 665 Ohm
Lead Channel Impedance Value: 722 Ohm
Lead Channel Impedance Value: 722 Ohm
Lead Channel Impedance Value: 779 Ohm
Lead Channel Impedance Value: 836 Ohm
Lead Channel Impedance Value: 836 Ohm
Lead Channel Pacing Threshold Amplitude: 0.5 V
Lead Channel Pacing Threshold Amplitude: 0.625 V
Lead Channel Pacing Threshold Amplitude: 1.375 V
Lead Channel Pacing Threshold Pulse Width: 0.4 ms
Lead Channel Pacing Threshold Pulse Width: 0.4 ms
Lead Channel Pacing Threshold Pulse Width: 0.4 ms
Lead Channel Sensing Intrinsic Amplitude: 11.375 mV
Lead Channel Sensing Intrinsic Amplitude: 11.375 mV
Lead Channel Sensing Intrinsic Amplitude: 2.625 mV
Lead Channel Sensing Intrinsic Amplitude: 2.625 mV
Lead Channel Setting Pacing Amplitude: 1.5 V
Lead Channel Setting Pacing Amplitude: 2 V
Lead Channel Setting Pacing Amplitude: 2.5 V
Lead Channel Setting Pacing Pulse Width: 0.4 ms
Lead Channel Setting Pacing Pulse Width: 0.4 ms
Lead Channel Setting Sensing Sensitivity: 0.3 mV

## 2021-06-20 NOTE — Progress Notes (Signed)
Remote ICD transmission.   

## 2021-07-14 ENCOUNTER — Encounter: Payer: Self-pay | Admitting: Internal Medicine

## 2021-07-14 ENCOUNTER — Ambulatory Visit: Payer: BC Managed Care – PPO | Admitting: Internal Medicine

## 2021-07-14 ENCOUNTER — Other Ambulatory Visit: Payer: Self-pay

## 2021-07-14 VITALS — BP 116/72 | HR 71 | Ht 71.0 in | Wt 182.0 lb

## 2021-07-14 DIAGNOSIS — I428 Other cardiomyopathies: Secondary | ICD-10-CM

## 2021-07-14 DIAGNOSIS — I5022 Chronic systolic (congestive) heart failure: Secondary | ICD-10-CM

## 2021-07-14 DIAGNOSIS — I447 Left bundle-branch block, unspecified: Secondary | ICD-10-CM

## 2021-07-14 DIAGNOSIS — Z95810 Presence of automatic (implantable) cardiac defibrillator: Secondary | ICD-10-CM

## 2021-07-14 NOTE — Patient Instructions (Signed)

## 2021-07-14 NOTE — Progress Notes (Signed)
Patient Care Team: Georgianne Fick, MD as PCP - General (Internal Medicine) Lyn Records, MD as PCP - Cardiology (Cardiology) Duke Salvia, MD as PCP - Electrophysiology (Cardiology)   HPI  Jeremy Wagner is a 60 y.o. male Seen in follow-up for syncope with left bundle-branch block and nonischemic cardiomyopathy. He underwent CRT- ICD implantation 3/18.  No interval syncope  Previous efforts to use entresto cx by cost .    Today, the patient denies chest pain, nocturnal dyspnea, orthopnea or peripheral edema.  There have been no palpitations, lightheadedness or syncope. Minim.  No yes Jeremy Wagner   DATE TEST EF   3/18 Echo   20-25%   11/18 MYOVIEW  38 %   5/22 Echo 50-55 %    Date Cr K Hgb  1/21 0.90 3.8       Records and Results Reviewed   Past Medical History:  Diagnosis Date   Anxiety    Biventricular ICD (implantable cardioverter-defibrillator) in place    Chronic combined systolic and diastolic CHF (congestive heart failure) (HCC)    GERD (gastroesophageal reflux disease)    Heart murmur    Hematuria    Hypertension    Insomnia    LBBB (left bundle branch block)    Nonischemic cardiomyopathy (HCC)    a. LVEF 35-40% by cath 01-13-08. Normal coronary arteries. b. 2D echo 2016: EF 30-35%. c. 2D echo 2018 EF 20-25%.    Past Surgical History:  Procedure Laterality Date   BIV ICD INSERTION CRT-D N/A 12/09/2016   Procedure: BiV ICD Insertion CRT-D;  Surgeon: Duke Salvia, MD;  Location: [facility] INVASIVE CV LAB;  Service: Cardiovascular;  Laterality: N/A;   CARDIAC CATHETERIZATION  01/2008   clean Coronaries, HTN CM??; EF ~30-35%    Current Outpatient Medications  Medication Sig Dispense Refill   aspirin EC 81 MG tablet Take 1 tablet (81 mg total) by mouth daily. 30 tablet 0   atorvastatin (LIPITOR) 20 MG tablet Take 20 mg by mouth daily.     carvedilol (COREG) 12.5 MG tablet Take 1 tablet (12.5 mg total) by mouth 2 (two) times daily with a meal. 180 tablet 3    lisinopril (ZESTRIL) 10 MG tablet Take 1 tablet (10 mg total) by mouth daily. 90 tablet 3   nitroGLYCERIN (NITROSTAT) 0.4 MG SL tablet Place 1 tablet (0.4 mg total) under the tongue every 5 (five) minutes as needed for chest pain (up to 3 doses). 25 tablet 3   No current facility-administered medications for this visit.    No Known Allergies    Review of Systems negative except from HPI and PMH  Physical Exam BP 116/72   Pulse 71   Ht 5\' 11"  (1.803 m)   Wt 182 lb (82.6 kg)   SpO2 97%   BMI 25.38 kg/m  Well developed and well nourished in no acute distress HENT normal Neck supple with JVP-flat Clear Device pocket well healed; without hematoma or erythema.  There is no tethering  Regular rate and rhythm, no murmur Abd-soft with active BS No Clubbing cyanosis  edema Skin-warm and dry A & Oriented  Grossly normal sensory and motor function  ECG sinus w P-synchronous/ AV  pacing @ 71 08/28/42 Upright QRS lead V1 negative QRS lead I  Assessment and  Plan NICM  LBBB  Syncope  CRT-D  Medtronic T    SVT  No interval syncope  Euvolemic.  Continue guideline directed therapy for his cardiomyopathy with  lisinopril 10 and carvedilol 12.5 twice daily.  With interval normalization of LV function, no indication for augmented therapy.  We will follow dyspnea to see whether an SGLT2 would be indicated.   I,[name],acting as a scribe for [name], MD.,have documented all relevant documentation on the behalf of [name], MD,as directed by  [name], MD while in the presence of [name], MD.  I, [name], MD, have reviewed all documentation for this visit. The documentation on 07/14/21 for the exam, diagnosis, procedures, and orders are all accurate and complete.

## 2021-09-11 ENCOUNTER — Ambulatory Visit (INDEPENDENT_AMBULATORY_CARE_PROVIDER_SITE_OTHER): Payer: BC Managed Care – PPO

## 2021-09-11 DIAGNOSIS — I428 Other cardiomyopathies: Secondary | ICD-10-CM | POA: Diagnosis not present

## 2021-09-12 LAB — CUP PACEART REMOTE DEVICE CHECK
Battery Remaining Longevity: 27 mo
Battery Voltage: 2.94 V
Brady Statistic AP VP Percent: 0.26 %
Brady Statistic AP VS Percent: 0.01 %
Brady Statistic AS VP Percent: 99.68 %
Brady Statistic AS VS Percent: 0.04 %
Brady Statistic RA Percent Paced: 0.28 %
Brady Statistic RV Percent Paced: 99.82 %
Date Time Interrogation Session: 20221230013726
HighPow Impedance: 72 Ohm
Implantable Lead Implant Date: 20180328
Implantable Lead Implant Date: 20180328
Implantable Lead Implant Date: 20180328
Implantable Lead Location: 753858
Implantable Lead Location: 753859
Implantable Lead Location: 753860
Implantable Lead Model: 5076
Implantable Pulse Generator Implant Date: 20180328
Lead Channel Impedance Value: 218.5 Ohm
Lead Channel Impedance Value: 218.5 Ohm
Lead Channel Impedance Value: 235.98 Ohm
Lead Channel Impedance Value: 235.98 Ohm
Lead Channel Impedance Value: 235.98 Ohm
Lead Channel Impedance Value: 342 Ohm
Lead Channel Impedance Value: 437 Ohm
Lead Channel Impedance Value: 437 Ohm
Lead Channel Impedance Value: 437 Ohm
Lead Channel Impedance Value: 437 Ohm
Lead Channel Impedance Value: 494 Ohm
Lead Channel Impedance Value: 513 Ohm
Lead Channel Impedance Value: 703 Ohm
Lead Channel Impedance Value: 703 Ohm
Lead Channel Impedance Value: 722 Ohm
Lead Channel Impedance Value: 836 Ohm
Lead Channel Impedance Value: 836 Ohm
Lead Channel Impedance Value: 855 Ohm
Lead Channel Pacing Threshold Amplitude: 0.5 V
Lead Channel Pacing Threshold Amplitude: 0.5 V
Lead Channel Pacing Threshold Amplitude: 1.5 V
Lead Channel Pacing Threshold Pulse Width: 0.4 ms
Lead Channel Pacing Threshold Pulse Width: 0.4 ms
Lead Channel Pacing Threshold Pulse Width: 0.4 ms
Lead Channel Sensing Intrinsic Amplitude: 13.875 mV
Lead Channel Sensing Intrinsic Amplitude: 13.875 mV
Lead Channel Sensing Intrinsic Amplitude: 2.25 mV
Lead Channel Sensing Intrinsic Amplitude: 2.25 mV
Lead Channel Setting Pacing Amplitude: 1.5 V
Lead Channel Setting Pacing Amplitude: 2.5 V
Lead Channel Setting Pacing Amplitude: 2.5 V
Lead Channel Setting Pacing Pulse Width: 0.4 ms
Lead Channel Setting Pacing Pulse Width: 0.4 ms
Lead Channel Setting Sensing Sensitivity: 0.3 mV

## 2021-09-24 NOTE — Progress Notes (Signed)
Remote ICD transmission.   

## 2021-12-11 ENCOUNTER — Ambulatory Visit (INDEPENDENT_AMBULATORY_CARE_PROVIDER_SITE_OTHER): Payer: BC Managed Care – PPO

## 2021-12-11 DIAGNOSIS — I428 Other cardiomyopathies: Secondary | ICD-10-CM | POA: Diagnosis not present

## 2021-12-11 LAB — CUP PACEART REMOTE DEVICE CHECK
Battery Remaining Longevity: 26 mo
Battery Voltage: 2.94 V
Brady Statistic AP VP Percent: 0.49 %
Brady Statistic AP VS Percent: 0.01 %
Brady Statistic AS VP Percent: 99.47 %
Brady Statistic AS VS Percent: 0.03 %
Brady Statistic RA Percent Paced: 0.5 %
Brady Statistic RV Percent Paced: 99.91 %
Date Time Interrogation Session: 20230330002204
HighPow Impedance: 64 Ohm
Implantable Lead Implant Date: 20180328
Implantable Lead Implant Date: 20180328
Implantable Lead Implant Date: 20180328
Implantable Lead Location: 753858
Implantable Lead Location: 753859
Implantable Lead Location: 753860
Implantable Lead Model: 5076
Implantable Pulse Generator Implant Date: 20180328
Lead Channel Impedance Value: 190 Ohm
Lead Channel Impedance Value: 194.634
Lead Channel Impedance Value: 214.783
Lead Channel Impedance Value: 214.783
Lead Channel Impedance Value: 220.723
Lead Channel Impedance Value: 323 Ohm
Lead Channel Impedance Value: 380 Ohm
Lead Channel Impedance Value: 380 Ohm
Lead Channel Impedance Value: 399 Ohm
Lead Channel Impedance Value: 399 Ohm
Lead Channel Impedance Value: 437 Ohm
Lead Channel Impedance Value: 494 Ohm
Lead Channel Impedance Value: 627 Ohm
Lead Channel Impedance Value: 646 Ohm
Lead Channel Impedance Value: 665 Ohm
Lead Channel Impedance Value: 722 Ohm
Lead Channel Impedance Value: 760 Ohm
Lead Channel Impedance Value: 779 Ohm
Lead Channel Pacing Threshold Amplitude: 0.5 V
Lead Channel Pacing Threshold Amplitude: 0.75 V
Lead Channel Pacing Threshold Amplitude: 1.625 V
Lead Channel Pacing Threshold Pulse Width: 0.4 ms
Lead Channel Pacing Threshold Pulse Width: 0.4 ms
Lead Channel Pacing Threshold Pulse Width: 0.4 ms
Lead Channel Sensing Intrinsic Amplitude: 12.125 mV
Lead Channel Sensing Intrinsic Amplitude: 12.125 mV
Lead Channel Sensing Intrinsic Amplitude: 2.25 mV
Lead Channel Sensing Intrinsic Amplitude: 2.25 mV
Lead Channel Setting Pacing Amplitude: 1.5 V
Lead Channel Setting Pacing Amplitude: 2.5 V
Lead Channel Setting Pacing Amplitude: 2.75 V
Lead Channel Setting Pacing Pulse Width: 0.4 ms
Lead Channel Setting Pacing Pulse Width: 0.4 ms
Lead Channel Setting Sensing Sensitivity: 0.3 mV

## 2021-12-23 NOTE — Progress Notes (Signed)
Remote ICD transmission.   

## 2022-02-10 ENCOUNTER — Other Ambulatory Visit: Payer: Self-pay | Admitting: Interventional Cardiology

## 2022-03-11 ENCOUNTER — Ambulatory Visit (INDEPENDENT_AMBULATORY_CARE_PROVIDER_SITE_OTHER): Payer: BC Managed Care – PPO

## 2022-03-11 DIAGNOSIS — I428 Other cardiomyopathies: Secondary | ICD-10-CM

## 2022-03-12 LAB — CUP PACEART REMOTE DEVICE CHECK
Battery Remaining Longevity: 24 mo
Battery Voltage: 2.93 V
Brady Statistic AP VP Percent: 1.07 %
Brady Statistic AP VS Percent: 0.01 %
Brady Statistic AS VP Percent: 98.88 %
Brady Statistic AS VS Percent: 0.04 %
Brady Statistic RA Percent Paced: 1.08 %
Brady Statistic RV Percent Paced: 99.87 %
Date Time Interrogation Session: 20230628033523
HighPow Impedance: 68 Ohm
Implantable Lead Implant Date: 20180328
Implantable Lead Implant Date: 20180328
Implantable Lead Implant Date: 20180328
Implantable Lead Location: 753858
Implantable Lead Location: 753859
Implantable Lead Location: 753860
Implantable Lead Model: 5076
Implantable Pulse Generator Implant Date: 20180328
Lead Channel Impedance Value: 194.634
Lead Channel Impedance Value: 203.256
Lead Channel Impedance Value: 221.667
Lead Channel Impedance Value: 228 Ohm
Lead Channel Impedance Value: 239.922
Lead Channel Impedance Value: 304 Ohm
Lead Channel Impedance Value: 380 Ohm
Lead Channel Impedance Value: 380 Ohm
Lead Channel Impedance Value: 399 Ohm
Lead Channel Impedance Value: 437 Ohm
Lead Channel Impedance Value: 456 Ohm
Lead Channel Impedance Value: 532 Ohm
Lead Channel Impedance Value: 646 Ohm
Lead Channel Impedance Value: 665 Ohm
Lead Channel Impedance Value: 665 Ohm
Lead Channel Impedance Value: 836 Ohm
Lead Channel Impedance Value: 855 Ohm
Lead Channel Impedance Value: 855 Ohm
Lead Channel Pacing Threshold Amplitude: 0.5 V
Lead Channel Pacing Threshold Amplitude: 0.625 V
Lead Channel Pacing Threshold Amplitude: 1.625 V
Lead Channel Pacing Threshold Pulse Width: 0.4 ms
Lead Channel Pacing Threshold Pulse Width: 0.4 ms
Lead Channel Pacing Threshold Pulse Width: 0.4 ms
Lead Channel Sensing Intrinsic Amplitude: 1.875 mV
Lead Channel Sensing Intrinsic Amplitude: 1.875 mV
Lead Channel Sensing Intrinsic Amplitude: 12.375 mV
Lead Channel Sensing Intrinsic Amplitude: 12.375 mV
Lead Channel Setting Pacing Amplitude: 1.5 V
Lead Channel Setting Pacing Amplitude: 2.5 V
Lead Channel Setting Pacing Amplitude: 2.75 V
Lead Channel Setting Pacing Pulse Width: 0.4 ms
Lead Channel Setting Pacing Pulse Width: 0.4 ms
Lead Channel Setting Sensing Sensitivity: 0.3 mV

## 2022-03-16 ENCOUNTER — Other Ambulatory Visit: Payer: Self-pay | Admitting: Internal Medicine

## 2022-03-20 ENCOUNTER — Other Ambulatory Visit: Payer: Self-pay | Admitting: Internal Medicine

## 2022-03-20 ENCOUNTER — Other Ambulatory Visit: Payer: Self-pay | Admitting: Interventional Cardiology

## 2022-03-31 NOTE — Progress Notes (Signed)
Remote ICD transmission.   

## 2022-04-10 ENCOUNTER — Other Ambulatory Visit: Payer: Self-pay | Admitting: Interventional Cardiology

## 2022-04-18 NOTE — Progress Notes (Signed)
Cardiology Office Note:    Date:  04/22/2022   ID:  Jeremy Wagner, DOB [birth year], MRN [id number]  PCP:  Georgianne Fick, MD  Cardiologist:  Lesleigh Noe, MD   Referring MD: Georgianne Fick, MD   Chief Complaint  Patient presents with   Hypertension   Congestive Heart Failure    History of Present Illness:    Jeremy Wagner is a 61 y.o. male with a hx of hypertension, nonobstructive coronary disease, long-standing history of LV systolic dysfunction, left bundle branch block, nonischemic cardiomyopathy, syncope 2018, and recent CRT-D implantation 11/2016.   Patient denies dyspnea on exertion, lower extremity edema, palpitations, device discharge, orthopnea, and syncope.  He works as a Arboriculturist indoors.  Gets a lot of walking in.  No complaints.  Clean  He does have occasional vague discomfort that occurs in his left chest and resolves in less than 1 to 2 minutes.  No exertional component.  No radiation.  Feels a little bit short of breath when it occurs.  No diaphoresis.  Past Medical History:  Diagnosis Date   Anxiety    Biventricular ICD (implantable cardioverter-defibrillator) in place    Chronic combined systolic and diastolic CHF (congestive heart failure) (HCC)    GERD (gastroesophageal reflux disease)    Heart murmur    Hematuria    Hypertension    Insomnia    LBBB (left bundle branch block)    Nonischemic cardiomyopathy (HCC)    a. LVEF 35-40% by cath 01-13-08. Normal coronary arteries. b. 2D echo 2016: EF 30-35%. c. 2D echo 2018 EF 20-25%.    Past Surgical History:  Procedure Laterality Date   BIV ICD INSERTION CRT-D N/A 12/09/2016   Procedure: BiV ICD Insertion CRT-D;  Surgeon: Duke Salvia, MD;  Location: [facility] INVASIVE CV LAB;  Service: Cardiovascular;  Laterality: N/A;   CARDIAC CATHETERIZATION  01/2008   clean Coronaries, HTN CM??; EF ~30-35%    Current Medications: Current Meds  Medication Sig   aspirin EC 81 MG tablet Take 1 tablet (81 mg total)  by mouth daily.   atorvastatin (LIPITOR) 20 MG tablet Take 20 mg by mouth daily.   carvedilol (COREG) 12.5 MG tablet Take 1 tablet (12.5 mg total) by mouth 2 (two) times daily with a meal. Please keep upcoming appointment for future refills. Thank you.   [DISCONTINUED] lisinopril (ZESTRIL) 10 MG tablet Take 1 tablet (10 mg total) by mouth daily. Keep follow up visit to get further refills.   [DISCONTINUED] nitroGLYCERIN (NITROSTAT) 0.4 MG SL tablet Place 1 tablet (0.4 mg total) under the tongue every 5 (five) minutes as needed for chest pain (up to 3 doses).     Allergies:   Patient has no known allergies.   Social History   Socioeconomic History   Marital status: Married    Spouse name: Not on file   Number of children: Not on file   Years of education: Not on file   Highest education level: Not on file  Occupational History   Not on file  Tobacco Use   Smoking status: Never   Smokeless tobacco: Never  Vaping Use   Vaping Use: Never used  Substance and Sexual Activity   Alcohol use: No    Alcohol/week: 0.0 standard drinks of alcohol   Drug use: No   Sexual activity: Not on file  Other Topics Concern   Not on file  Social History Narrative   Works for the city   Physically very active  job.   Has 2 summer jobs both of which are very physical   [place] a lot   Drinks ~ 24 Oz of caffeine in am   3 kids   Married   Lives in [place] [employer]   Never smoker   Social Determinants of Corporate investment banker Strain: Not on file  Food Insecurity: Not on file  Transportation Needs: Not on file  Physical Activity: Not on file  Stress: Not on file  Social Connections: Not on file     Family History: The patient's family history includes CAD (age of onset: 63) in his paternal aunt; Coronary artery disease (age of onset: 64) in his paternal uncle; Hypertension in his father and mother.  ROS:   Please see the history of present illness.    All in all he feels well.   Staying active.  All other systems reviewed and are negative.  EKGs/Labs/Other Studies Reviewed:    The following studies were reviewed today:  Echocardiogram 2022: IMPRESSIONS   1. Left ventricular ejection fraction, by estimation, is 50 to 55%. The  left ventricle has low normal function. The left ventricle has no regional  wall motion abnormalities. Left ventricular diastolic parameters were  normal.   2. Right ventricular systolic function is normal. The right ventricular  size is normal.   3. Left atrial size was mildly dilated.   4. The mitral valve is normal in structure. Trivial mitral valve  regurgitation. No evidence of mitral stenosis.   5. The aortic valve is tricuspid. Aortic valve regurgitation is trivial.  Mild aortic valve sclerosis is present, with no evidence of aortic valve  stenosis.   6. The inferior vena cava is normal in size with greater than 50%  respiratory variability, suggesting right atrial pressure of 3 mmHg.   Comparison(s): Prior images reviewed side by side. There is substantial  improvement in left ventricular function due to markedly improved  contractile synchrony (successful CRT).   EKG:  EKG atrial tracking with ventricular pacing.  Right bundle, superior axis.  Compared to 07/15/2021, no changes noted.  Recent Labs: No results found for requested labs within last 365 days.  Recent Lipid Panel No results found for: "CHOL", "TRIG", "HDL", "CHOLHDL", "VLDL", "LDLCALC", "LDLDIRECT"  Physical Exam:    VS:  BP 118/82   Pulse (!) 54   Ht 5\' 11"  (1.803 m)   Wt 182 lb 3.2 oz (82.6 kg)   SpO2 95%   BMI 25.41 kg/m     Wt Readings from Last 3 Encounters:  04/22/22 182 lb 3.2 oz (82.6 kg)  07/14/21 182 lb (82.6 kg)  12/24/20 177 lb (80.3 kg)     GEN: Healthy appearing, slightly heavier than previous.. No acute distress HEENT: Normal NECK: No JVD. LYMPHATICS: No lymphadenopathy CARDIAC: No murmur. RRR S4 but no S3 gallop, or  edema. VASCULAR: No normal Pulses. No bruits. RESPIRATORY:  Clear to auscultation without rales, wheezing or rhonchi  ABDOMEN: Soft, non-tender, non-distended, No pulsatile mass, MUSCULOSKELETAL: No deformity  SKIN: Warm and dry NEUROLOGIC:  Alert and oriented x 3 PSYCHIATRIC:  Normal affect   ASSESSMENT:    1. Chronic systolic heart failure (HCC)   2. Nonischemic cardiomyopathy (HCC)   3. AICD (automatic cardioverter/defibrillator) present   4. LBBB (left bundle branch block)   5. Benign essential HTN   6. NSVT (nonsustained ventricular tachycardia) (HCC)    PLAN:    In order of problems listed above:  Systolic  heart failure has improved to low normal systolic function with BiV pacer. Improved LV function.  No need for study this year. Needs to set an appointment to see Dr. Graciela Husbands at some point this year.  Last follow-up was in October 2022.  Likely to occur sometime before the end of the year. He has a resynchronize QRS complex.  Unchanged from last year. Excellent blood pressure control on current therapy. No symptomatic episodes of ventricular tachycardia.  No recurrence of syncope.  Informed he will have a new cardiologist or return to general cardiology due to my retirement.  He wished me well.  His sentiments were greatly appreciated.   Medication Adjustments/Labs and Tests Ordered: Current medicines are reviewed at length with the patient today.  Concerns regarding medicines are outlined above.  Orders Placed This Encounter  Procedures   EKG 12-Lead   Meds ordered this encounter  Medications   lisinopril (ZESTRIL) 10 MG tablet    Sig: Take 1 tablet (10 mg total) by mouth daily.    Dispense:  90 tablet    Refill:  3    Patient request 90 day refills on medications.   nitroGLYCERIN (NITROSTAT) 0.4 MG SL tablet    Sig: Place 1 tablet (0.4 mg total) under the tongue every 5 (five) minutes as needed for chest pain (up to 3 doses).    Dispense:  25 tablet    Refill:   3    There are no Patient Instructions on file for this visit.   Signed, Lesleigh Noe, MD  04/22/2022 12:24 PM    [facility]

## 2022-04-22 ENCOUNTER — Ambulatory Visit: Payer: BC Managed Care – PPO | Admitting: Interventional Cardiology

## 2022-04-22 ENCOUNTER — Encounter: Payer: Self-pay | Admitting: Interventional Cardiology

## 2022-04-22 VITALS — BP 118/82 | HR 54 | Ht 71.0 in | Wt 182.2 lb

## 2022-04-22 DIAGNOSIS — I447 Left bundle-branch block, unspecified: Secondary | ICD-10-CM

## 2022-04-22 DIAGNOSIS — I428 Other cardiomyopathies: Secondary | ICD-10-CM | POA: Diagnosis not present

## 2022-04-22 DIAGNOSIS — I10 Essential (primary) hypertension: Secondary | ICD-10-CM

## 2022-04-22 DIAGNOSIS — Z95810 Presence of automatic (implantable) cardiac defibrillator: Secondary | ICD-10-CM | POA: Diagnosis not present

## 2022-04-22 DIAGNOSIS — I4729 Other ventricular tachycardia: Secondary | ICD-10-CM

## 2022-04-22 DIAGNOSIS — I5022 Chronic systolic (congestive) heart failure: Secondary | ICD-10-CM

## 2022-04-22 MED ORDER — LISINOPRIL 10 MG PO TABS: 10.0000 mg | ORAL_TABLET | Freq: Every day | ORAL | 3 refills | Status: DC

## 2022-04-22 MED ORDER — NITROGLYCERIN 0.4 MG SL SUBL: 0.4000 mg | SUBLINGUAL_TABLET | SUBLINGUAL | 3 refills | Status: DC | PRN

## 2022-04-22 NOTE — Patient Instructions (Signed)
Medication Instructions:  ?Your physician recommends that you continue on your current medications as directed. Please refer to the Current Medication list given to you today. ? ?*If you need a refill on your cardiac medications before your next appointment, please call your pharmacy* ? ?Lab Work: ?NONE ? ?Testing/Procedures: ?NONE ? ?Follow-Up: ?At [facility], you and your health needs are our priority.  As part of our continuing mission to provide you with exceptional heart care, we have created designated Provider Care Teams.  These Care Teams include your primary Cardiologist (physician) and Advanced Practice Providers (APPs -  Physician Assistants and Nurse Practitioners) who all work together to provide you with the care you need, when you need it. ? ?Your next appointment:   ?9-12 month(s) ? ?The format for your next appointment:   ?In Person ? ?Provider:   ?Henry W Smith [name], MD { ? ? ? ?Important Information About Sugar ? ? ? ? ?  ?

## 2022-06-10 ENCOUNTER — Ambulatory Visit (INDEPENDENT_AMBULATORY_CARE_PROVIDER_SITE_OTHER): Payer: BC Managed Care – PPO

## 2022-06-10 DIAGNOSIS — I428 Other cardiomyopathies: Secondary | ICD-10-CM

## 2022-06-11 ENCOUNTER — Other Ambulatory Visit: Payer: Self-pay | Admitting: Internal Medicine

## 2022-06-11 LAB — CUP PACEART REMOTE DEVICE CHECK
Battery Remaining Longevity: 24 mo
Battery Voltage: 2.93 V
Brady Statistic AP VP Percent: 1.55 %
Brady Statistic AP VS Percent: 0.01 %
Brady Statistic AS VP Percent: 98.39 %
Brady Statistic AS VS Percent: 0.04 %
Brady Statistic RA Percent Paced: 1.57 %
Brady Statistic RV Percent Paced: 99.83 %
Date Time Interrogation Session: 20230927033624
HighPow Impedance: 63 Ohm
Implantable Lead Implant Date: 20180328
Implantable Lead Implant Date: 20180328
Implantable Lead Implant Date: 20180328
Implantable Lead Location: 753858
Implantable Lead Location: 753859
Implantable Lead Location: 753860
Implantable Lead Model: 5076
Implantable Pulse Generator Implant Date: 20180328
Lead Channel Impedance Value: 194.634
Lead Channel Impedance Value: 199.5 Ohm
Lead Channel Impedance Value: 218.298
Lead Channel Impedance Value: 224.438
Lead Channel Impedance Value: 224.438
Lead Channel Impedance Value: 323 Ohm
Lead Channel Impedance Value: 342 Ohm
Lead Channel Impedance Value: 380 Ohm
Lead Channel Impedance Value: 399 Ohm
Lead Channel Impedance Value: 399 Ohm
Lead Channel Impedance Value: 437 Ohm
Lead Channel Impedance Value: 513 Ohm
Lead Channel Impedance Value: 627 Ohm
Lead Channel Impedance Value: 646 Ohm
Lead Channel Impedance Value: 646 Ohm
Lead Channel Impedance Value: 779 Ohm
Lead Channel Impedance Value: 836 Ohm
Lead Channel Impedance Value: 836 Ohm
Lead Channel Pacing Threshold Amplitude: 0.5 V
Lead Channel Pacing Threshold Amplitude: 0.5 V
Lead Channel Pacing Threshold Amplitude: 1.625 V
Lead Channel Pacing Threshold Pulse Width: 0.4 ms
Lead Channel Pacing Threshold Pulse Width: 0.4 ms
Lead Channel Pacing Threshold Pulse Width: 0.4 ms
Lead Channel Sensing Intrinsic Amplitude: 13.125 mV
Lead Channel Sensing Intrinsic Amplitude: 13.125 mV
Lead Channel Sensing Intrinsic Amplitude: 2 mV
Lead Channel Sensing Intrinsic Amplitude: 2 mV
Lead Channel Setting Pacing Amplitude: 1.5 V
Lead Channel Setting Pacing Amplitude: 2.5 V
Lead Channel Setting Pacing Amplitude: 2.75 V
Lead Channel Setting Pacing Pulse Width: 0.4 ms
Lead Channel Setting Pacing Pulse Width: 0.4 ms
Lead Channel Setting Sensing Sensitivity: 0.3 mV

## 2022-06-18 NOTE — Progress Notes (Signed)
Remote ICD transmission.   

## 2022-08-19 ENCOUNTER — Other Ambulatory Visit: Payer: Self-pay | Admitting: Interventional Cardiology

## 2022-09-02 ENCOUNTER — Encounter: Payer: Self-pay | Admitting: Internal Medicine

## 2022-09-02 ENCOUNTER — Ambulatory Visit: Payer: BC Managed Care – PPO | Attending: Internal Medicine | Admitting: Internal Medicine

## 2022-09-02 VITALS — BP 120/84 | HR 61 | Ht 71.0 in | Wt 185.2 lb

## 2022-09-02 DIAGNOSIS — I428 Other cardiomyopathies: Secondary | ICD-10-CM

## 2022-09-02 DIAGNOSIS — Z95810 Presence of automatic (implantable) cardiac defibrillator: Secondary | ICD-10-CM | POA: Diagnosis not present

## 2022-09-02 DIAGNOSIS — I447 Left bundle-branch block, unspecified: Secondary | ICD-10-CM | POA: Diagnosis not present

## 2022-09-02 DIAGNOSIS — I5022 Chronic systolic (congestive) heart failure: Secondary | ICD-10-CM | POA: Diagnosis not present

## 2022-09-02 NOTE — Patient Instructions (Signed)
Medication Instructions:  Your physician recommends that you continue on your current medications as directed. Please refer to the Current Medication list given to you today.  *If you need a refill on your cardiac medications before your next appointment, please call your pharmacy*   Lab Work: CBC and BMET today If you have labs (blood work) drawn today and your tests are completely normal, you will receive your results only by: MyChart Message (if you have MyChart) OR A paper copy in the mail If you have any lab test that is abnormal or we need to change your treatment, we will call you to review the results.   Testing/Procedures: None ordered.    Follow-Up: At [facility], you and your health needs are our priority.  As part of our continuing mission to provide you with exceptional heart care, we have created designated Provider Care Teams.  These Care Teams include your primary Cardiologist (physician) and Advanced Practice Providers (APPs -  Physician Assistants and Nurse Practitioners) who all work together to provide you with the care you need, when you need it.  We recommend signing up for the patient portal called "MyChart".  Sign up information is provided on this After Visit Summary.  MyChart is used to connect with patients for Virtual Visits (Telemedicine).  Patients are able to view lab/test results, encounter notes, upcoming appointments, etc.  Non-urgent messages can be sent to your provider as well.   To learn more about what you can do with MyChart, go to [email or web address].    Your next appointment:   12 months with Dr Klein  Important Information About Sugar       

## 2022-09-02 NOTE — Progress Notes (Signed)
Patient Care Team: Georgianne Fick, MD as PCP - General (Internal Medicine) Lyn Records, MD as PCP - Cardiology (Cardiology) Duke Salvia, MD as PCP - Electrophysiology (Cardiology)   HPI  Doc B Poythress is a 61 y.o. male Seen in follow-up for syncope with left bundle-branch block and nonischemic cardiomyopathy. He underwent CRT- ICD implantation 3/18.  No interval syncope  The patient denies shortness of breath, nocturnal dyspnea, orthopnea or peripheral edema.  There have been no palpitations, lightheadedness or syncope.  Complains of chest discomfort.  Stable over the last 5 or 6 years.  Unrelated to exertion a pressure sensation in the middle of his chest without radiation without dyspnea.   DATE TEST EF   3/18 Echo   20-25%   11/18 MYOVIEW  38 %   5/22 Echo 50-55 %    Date Cr K Hgb  1/21 0.90 3.8       Records and Results Reviewed   Past Medical History:  Diagnosis Date   Anxiety    Biventricular ICD (implantable cardioverter-defibrillator) in place    Chronic combined systolic and diastolic CHF (congestive heart failure) (HCC)    GERD (gastroesophageal reflux disease)    Heart murmur    Hematuria    Hypertension    Insomnia    LBBB (left bundle branch block)    Nonischemic cardiomyopathy (HCC)    a. LVEF 35-40% by cath 01-13-08. Normal coronary arteries. b. 2D echo 2016: EF 30-35%. c. 2D echo 2018 EF 20-25%.    Past Surgical History:  Procedure Laterality Date   BIV ICD INSERTION CRT-D N/A 12/09/2016   Procedure: BiV ICD Insertion CRT-D;  Surgeon: Duke Salvia, MD;  Location: [facility] INVASIVE CV LAB;  Service: Cardiovascular;  Laterality: N/A;   CARDIAC CATHETERIZATION  01/2008   clean Coronaries, HTN CM??; EF ~30-35%    Current Outpatient Medications  Medication Sig Dispense Refill   aspirin EC 81 MG tablet Take 1 tablet (81 mg total) by mouth daily. 30 tablet 0   atorvastatin (LIPITOR) 20 MG tablet Take 20 mg by mouth daily.     carvedilol  (COREG) 12.5 MG tablet TAKE 1 TABLET (12.5MG  TOTAL) BY MOUTH TWICE A DAY WITH MEALS 60 tablet 0   lisinopril (ZESTRIL) 10 MG tablet Take 1 tablet (10 mg total) by mouth daily. 90 tablet 3   nitroGLYCERIN (NITROSTAT) 0.4 MG SL tablet Place 1 tablet (0.4 mg total) under the tongue every 5 (five) minutes as needed for chest pain (up to 3 doses). 25 tablet 3   pantoprazole (PROTONIX) 40 MG tablet Take 40 mg by mouth daily.     No current facility-administered medications for this visit.    No Known Allergies    Review of Systems negative except from HPI and PMH  Physical Exam BP 120/84   Pulse 61   Ht 5\' 11"  (1.803 m)   Wt 185 lb 3.2 oz (84 kg)   SpO2 96%   BMI 25.83 kg/m  Well developed and well nourished in no acute distress  HENT normal Neck supple with JVP-flat Clear Device pocket well healed; without hematoma or erythema.  There is no tethering  Regular rate and rhythm, no  gallop No  murmur Abd-soft with active BS No Clubbing cyanosis  edema Skin-warm and dry A & Oriented  Grossly normal sensory and motor function  ECG  sinus at 61 with P synchronous pacing 08/28/43 Upright QRS lead V1 negative QRS lead I  Device  function is normal. Programming changes none  See Paceart for details    Assessment and  Plan NICM  LBBB  Syncope  CRT-D  Medtronic   SVT  No interval syncope   Euvolemic.  Resolved cardiomyopathy we will continue the carvedilol 12.5 twice daily and lisinopril 10.  Chest pains stable over the last 5 or 6 years nonexertional.  In the context of his known nonobstructive coronary disease we will just follow.  He has been started on pantoprazole which is reasonable

## 2022-09-03 LAB — BASIC METABOLIC PANEL
BUN/Creatinine Ratio: 8 — ABNORMAL LOW (ref 10–24)
BUN: 12 mg/dL (ref 8–27)
CO2: 24 mmol/L (ref 20–29)
Calcium: 9.1 mg/dL (ref 8.6–10.2)
Chloride: 104 mmol/L (ref 96–106)
Creatinine, Ser: 1.53 mg/dL — ABNORMAL HIGH (ref 0.76–1.27)
Glucose: 105 mg/dL — ABNORMAL HIGH (ref 70–99)
Potassium: 4.4 mmol/L (ref 3.5–5.2)
Sodium: 140 mmol/L (ref 134–144)
eGFR: 51 mL/min/{1.73_m2} — ABNORMAL LOW (ref >59)

## 2022-09-03 LAB — CBC
Hematocrit: 39.2 % (ref 37.5–51.0)
Hemoglobin: 13.1 g/dL (ref 13.0–17.7)
MCH: 32 pg (ref 26.6–33.0)
MCHC: 33.4 g/dL (ref 31.5–35.7)
MCV: 96 fL (ref 79–97)
Platelets: 158 10*3/uL (ref 150–450)
RBC: 4.09 x10E6/uL — ABNORMAL LOW (ref 4.14–5.80)
RDW: 11.9 % (ref 11.6–15.4)
WBC: 7.1 10*3/uL (ref 3.4–10.8)

## 2022-09-09 ENCOUNTER — Ambulatory Visit (INDEPENDENT_AMBULATORY_CARE_PROVIDER_SITE_OTHER): Payer: BC Managed Care – PPO

## 2022-09-09 DIAGNOSIS — I428 Other cardiomyopathies: Secondary | ICD-10-CM | POA: Diagnosis not present

## 2022-09-10 LAB — CUP PACEART REMOTE DEVICE CHECK
Battery Remaining Longevity: 21 mo
Battery Voltage: 2.92 V
Brady Statistic AP VP Percent: 1.09 %
Brady Statistic AP VS Percent: 0.02 %
Brady Statistic AS VP Percent: 98.85 %
Brady Statistic AS VS Percent: 0.05 %
Brady Statistic RA Percent Paced: 1.11 %
Brady Statistic RV Percent Paced: 99.9 %
Date Time Interrogation Session: 20231227012304
HighPow Impedance: 67 Ohm
Implantable Lead Connection Status: 753985
Implantable Lead Connection Status: 753985
Implantable Lead Connection Status: 753985
Implantable Lead Implant Date: 20180328
Implantable Lead Implant Date: 20180328
Implantable Lead Implant Date: 20180328
Implantable Lead Location: 753858
Implantable Lead Location: 753859
Implantable Lead Location: 753860
Implantable Lead Model: 5076
Implantable Pulse Generator Implant Date: 20180328
Lead Channel Impedance Value: 208.568
Lead Channel Impedance Value: 218.5 Ohm
Lead Channel Impedance Value: 224.438
Lead Channel Impedance Value: 235.98 Ohm
Lead Channel Impedance Value: 235.98 Ohm
Lead Channel Impedance Value: 399 Ohm
Lead Channel Impedance Value: 399 Ohm
Lead Channel Impedance Value: 399 Ohm
Lead Channel Impedance Value: 437 Ohm
Lead Channel Impedance Value: 437 Ohm
Lead Channel Impedance Value: 437 Ohm
Lead Channel Impedance Value: 513 Ohm
Lead Channel Impedance Value: 703 Ohm
Lead Channel Impedance Value: 722 Ohm
Lead Channel Impedance Value: 722 Ohm
Lead Channel Impedance Value: 779 Ohm
Lead Channel Impedance Value: 836 Ohm
Lead Channel Impedance Value: 836 Ohm
Lead Channel Pacing Threshold Amplitude: 0.375 V
Lead Channel Pacing Threshold Amplitude: 0.5 V
Lead Channel Pacing Threshold Amplitude: 1.25 V
Lead Channel Pacing Threshold Pulse Width: 0.4 ms
Lead Channel Pacing Threshold Pulse Width: 0.4 ms
Lead Channel Pacing Threshold Pulse Width: 0.8 ms
Lead Channel Sensing Intrinsic Amplitude: 14.5 mV
Lead Channel Sensing Intrinsic Amplitude: 14.5 mV
Lead Channel Sensing Intrinsic Amplitude: 2.25 mV
Lead Channel Sensing Intrinsic Amplitude: 2.25 mV
Lead Channel Setting Pacing Amplitude: 1.5 V
Lead Channel Setting Pacing Amplitude: 2.5 V
Lead Channel Setting Pacing Amplitude: 2.5 V
Lead Channel Setting Pacing Pulse Width: 0.4 ms
Lead Channel Setting Pacing Pulse Width: 0.8 ms
Lead Channel Setting Sensing Sensitivity: 0.3 mV
Zone Setting Status: 755011
Zone Setting Status: 755011

## 2022-09-16 ENCOUNTER — Other Ambulatory Visit: Payer: Self-pay | Admitting: Internal Medicine

## 2022-09-24 ENCOUNTER — Telehealth: Payer: Self-pay

## 2022-09-24 NOTE — Telephone Encounter (Signed)
-----   Message from Deboraha Sprang, MD sent at 09/12/2022  3:05 PM EST ----- Please Inform Patient  Labs are normal w improved blood count but  NEW abbnormaliy in kidney function   this should be repeated probably by his PCP and if not we can   Thanks

## 2022-09-24 NOTE — Telephone Encounter (Signed)
Spoke with pt and advised of lab results per Dr Caryl Comes as below.  Pt verbalizes understanding and states he will contact his PCP to have further review and testing completed.  Pt thanked Therapist, sports for the call.

## 2022-09-29 NOTE — Progress Notes (Signed)
Remote ICD transmission.   

## 2022-12-09 ENCOUNTER — Ambulatory Visit (INDEPENDENT_AMBULATORY_CARE_PROVIDER_SITE_OTHER): Payer: BC Managed Care – PPO

## 2022-12-09 DIAGNOSIS — I428 Other cardiomyopathies: Secondary | ICD-10-CM | POA: Diagnosis not present

## 2022-12-09 LAB — CUP PACEART REMOTE DEVICE CHECK
Battery Remaining Longevity: 20 mo
Battery Voltage: 2.91 V
Brady Statistic AP VP Percent: 0.77 %
Brady Statistic AP VS Percent: 0.01 %
Brady Statistic AS VP Percent: 99.16 %
Brady Statistic AS VS Percent: 0.05 %
Brady Statistic RA Percent Paced: 0.78 %
Brady Statistic RV Percent Paced: 99.81 %
Date Time Interrogation Session: 20240327063526
HighPow Impedance: 67 Ohm
Implantable Lead Connection Status: 753985
Implantable Lead Connection Status: 753985
Implantable Lead Connection Status: 753985
Implantable Lead Implant Date: 20180328
Implantable Lead Implant Date: 20180328
Implantable Lead Implant Date: 20180328
Implantable Lead Location: 753858
Implantable Lead Location: 753859
Implantable Lead Location: 753860
Implantable Lead Model: 5076
Implantable Pulse Generator Implant Date: 20180328
Lead Channel Impedance Value: 194.634
Lead Channel Impedance Value: 194.634
Lead Channel Impedance Value: 221.667
Lead Channel Impedance Value: 221.667
Lead Channel Impedance Value: 228 Ohm
Lead Channel Impedance Value: 380 Ohm
Lead Channel Impedance Value: 380 Ohm
Lead Channel Impedance Value: 380 Ohm
Lead Channel Impedance Value: 399 Ohm
Lead Channel Impedance Value: 399 Ohm
Lead Channel Impedance Value: 437 Ohm
Lead Channel Impedance Value: 532 Ohm
Lead Channel Impedance Value: 627 Ohm
Lead Channel Impedance Value: 665 Ohm
Lead Channel Impedance Value: 665 Ohm
Lead Channel Impedance Value: 779 Ohm
Lead Channel Impedance Value: 836 Ohm
Lead Channel Impedance Value: 836 Ohm
Lead Channel Pacing Threshold Amplitude: 0.5 V
Lead Channel Pacing Threshold Amplitude: 0.5 V
Lead Channel Pacing Threshold Amplitude: 1.125 V
Lead Channel Pacing Threshold Pulse Width: 0.4 ms
Lead Channel Pacing Threshold Pulse Width: 0.4 ms
Lead Channel Pacing Threshold Pulse Width: 0.8 ms
Lead Channel Sensing Intrinsic Amplitude: 1.75 mV
Lead Channel Sensing Intrinsic Amplitude: 1.75 mV
Lead Channel Sensing Intrinsic Amplitude: 12.375 mV
Lead Channel Sensing Intrinsic Amplitude: 12.375 mV
Lead Channel Setting Pacing Amplitude: 1.5 V
Lead Channel Setting Pacing Amplitude: 1.75 V
Lead Channel Setting Pacing Amplitude: 2.5 V
Lead Channel Setting Pacing Pulse Width: 0.4 ms
Lead Channel Setting Pacing Pulse Width: 0.8 ms
Lead Channel Setting Sensing Sensitivity: 0.3 mV
Zone Setting Status: 755011
Zone Setting Status: 755011

## 2022-12-29 ENCOUNTER — Other Ambulatory Visit: Payer: Self-pay

## 2022-12-29 MED ORDER — NITROGLYCERIN 0.4 MG SL SUBL: 0.4000 mg | SUBLINGUAL_TABLET | SUBLINGUAL | 0 refills | Status: DC | PRN

## 2022-12-31 ENCOUNTER — Other Ambulatory Visit: Payer: Self-pay

## 2022-12-31 MED ORDER — NITROGLYCERIN 0.4 MG SL SUBL: 0.4000 mg | SUBLINGUAL_TABLET | SUBLINGUAL | 5 refills | Status: AC | PRN

## 2023-01-18 NOTE — Progress Notes (Signed)
Remote ICD transmission.   

## 2023-03-10 ENCOUNTER — Ambulatory Visit: Payer: BC Managed Care – PPO

## 2023-03-10 DIAGNOSIS — I428 Other cardiomyopathies: Secondary | ICD-10-CM | POA: Diagnosis not present

## 2023-03-12 LAB — CUP PACEART REMOTE DEVICE CHECK
Battery Remaining Longevity: 20 mo
Battery Voltage: 2.89 V
Brady Statistic AP VP Percent: 1.18 %
Brady Statistic AP VS Percent: 0.01 %
Brady Statistic AS VP Percent: 98.74 %
Brady Statistic AS VS Percent: 0.07 %
Brady Statistic RA Percent Paced: 1.19 %
Brady Statistic RV Percent Paced: 99.79 %
Date Time Interrogation Session: 20240628072109
HighPow Impedance: 67 Ohm
Implantable Lead Connection Status: 753985
Implantable Lead Connection Status: 753985
Implantable Lead Connection Status: 753985
Implantable Lead Implant Date: 20180328
Implantable Lead Implant Date: 20180328
Implantable Lead Implant Date: 20180328
Implantable Lead Location: 753858
Implantable Lead Location: 753859
Implantable Lead Location: 753860
Implantable Lead Model: 5076
Implantable Pulse Generator Implant Date: 20180328
Lead Channel Impedance Value: 194.634
Lead Channel Impedance Value: 194.634
Lead Channel Impedance Value: 214.783
Lead Channel Impedance Value: 214.783
Lead Channel Impedance Value: 220.723
Lead Channel Impedance Value: 323 Ohm
Lead Channel Impedance Value: 323 Ohm
Lead Channel Impedance Value: 380 Ohm
Lead Channel Impedance Value: 380 Ohm
Lead Channel Impedance Value: 399 Ohm
Lead Channel Impedance Value: 437 Ohm
Lead Channel Impedance Value: 494 Ohm
Lead Channel Impedance Value: 627 Ohm
Lead Channel Impedance Value: 646 Ohm
Lead Channel Impedance Value: 646 Ohm
Lead Channel Impedance Value: 760 Ohm
Lead Channel Impedance Value: 779 Ohm
Lead Channel Impedance Value: 779 Ohm
Lead Channel Pacing Threshold Amplitude: 0.5 V
Lead Channel Pacing Threshold Amplitude: 0.625 V
Lead Channel Pacing Threshold Amplitude: 1.125 V
Lead Channel Pacing Threshold Pulse Width: 0.4 ms
Lead Channel Pacing Threshold Pulse Width: 0.4 ms
Lead Channel Pacing Threshold Pulse Width: 0.8 ms
Lead Channel Sensing Intrinsic Amplitude: 1.875 mV
Lead Channel Sensing Intrinsic Amplitude: 1.875 mV
Lead Channel Sensing Intrinsic Amplitude: 11.25 mV
Lead Channel Sensing Intrinsic Amplitude: 11.25 mV
Lead Channel Setting Pacing Amplitude: 1.5 V
Lead Channel Setting Pacing Amplitude: 1.75 V
Lead Channel Setting Pacing Amplitude: 2.5 V
Lead Channel Setting Pacing Pulse Width: 0.4 ms
Lead Channel Setting Pacing Pulse Width: 0.8 ms
Lead Channel Setting Sensing Sensitivity: 0.3 mV
Zone Setting Status: 755011
Zone Setting Status: 755011

## 2023-03-31 NOTE — Progress Notes (Signed)
 Remote ICD transmission.   

## 2023-06-10 ENCOUNTER — Ambulatory Visit (INDEPENDENT_AMBULATORY_CARE_PROVIDER_SITE_OTHER): Payer: BC Managed Care – PPO

## 2023-06-10 DIAGNOSIS — I428 Other cardiomyopathies: Secondary | ICD-10-CM | POA: Diagnosis not present

## 2023-06-10 LAB — CUP PACEART REMOTE DEVICE CHECK
Battery Remaining Longevity: 18 mo
Battery Voltage: 2.89 V
Brady Statistic AP VP Percent: 1.06 %
Brady Statistic AP VS Percent: 0.01 %
Brady Statistic AS VP Percent: 98.86 %
Brady Statistic AS VS Percent: 0.06 %
Brady Statistic RA Percent Paced: 1.08 %
Brady Statistic RV Percent Paced: 99.86 %
Date Time Interrogation Session: 20240926001804
HighPow Impedance: 73 Ohm
Implantable Lead Connection Status: 753985
Implantable Lead Connection Status: 753985
Implantable Lead Connection Status: 753985
Implantable Lead Implant Date: 20180328
Implantable Lead Implant Date: 20180328
Implantable Lead Implant Date: 20180328
Implantable Lead Location: 753858
Implantable Lead Location: 753859
Implantable Lead Location: 753860
Implantable Lead Model: 5076
Implantable Pulse Generator Implant Date: 20180328
Lead Channel Impedance Value: 199.5 Ohm
Lead Channel Impedance Value: 208.568
Lead Channel Impedance Value: 224.438
Lead Channel Impedance Value: 224.438
Lead Channel Impedance Value: 235.98 Ohm
Lead Channel Impedance Value: 380 Ohm
Lead Channel Impedance Value: 380 Ohm
Lead Channel Impedance Value: 399 Ohm
Lead Channel Impedance Value: 399 Ohm
Lead Channel Impedance Value: 437 Ohm
Lead Channel Impedance Value: 437 Ohm
Lead Channel Impedance Value: 513 Ohm
Lead Channel Impedance Value: 646 Ohm
Lead Channel Impedance Value: 703 Ohm
Lead Channel Impedance Value: 703 Ohm
Lead Channel Impedance Value: 760 Ohm
Lead Channel Impedance Value: 817 Ohm
Lead Channel Impedance Value: 836 Ohm
Lead Channel Pacing Threshold Amplitude: 0.5 V
Lead Channel Pacing Threshold Amplitude: 0.5 V
Lead Channel Pacing Threshold Amplitude: 1 V
Lead Channel Pacing Threshold Pulse Width: 0.4 ms
Lead Channel Pacing Threshold Pulse Width: 0.4 ms
Lead Channel Pacing Threshold Pulse Width: 0.8 ms
Lead Channel Sensing Intrinsic Amplitude: 14.625 mV
Lead Channel Sensing Intrinsic Amplitude: 14.625 mV
Lead Channel Sensing Intrinsic Amplitude: 2 mV
Lead Channel Sensing Intrinsic Amplitude: 2 mV
Lead Channel Setting Pacing Amplitude: 1.5 V
Lead Channel Setting Pacing Amplitude: 1.75 V
Lead Channel Setting Pacing Amplitude: 2.5 V
Lead Channel Setting Pacing Pulse Width: 0.4 ms
Lead Channel Setting Pacing Pulse Width: 0.8 ms
Lead Channel Setting Sensing Sensitivity: 0.3 mV
Zone Setting Status: 755011
Zone Setting Status: 755011

## 2023-06-22 NOTE — Progress Notes (Signed)
Remote ICD transmission.   

## 2023-09-10 ENCOUNTER — Ambulatory Visit (INDEPENDENT_AMBULATORY_CARE_PROVIDER_SITE_OTHER): Payer: BC Managed Care – PPO

## 2023-09-10 DIAGNOSIS — I428 Other cardiomyopathies: Secondary | ICD-10-CM | POA: Diagnosis not present

## 2023-09-11 LAB — CUP PACEART REMOTE DEVICE CHECK
Battery Remaining Longevity: 15 mo
Battery Voltage: 2.87 V
Brady Statistic AP VP Percent: 0.59 %
Brady Statistic AP VS Percent: 0.01 %
Brady Statistic AS VP Percent: 99.34 %
Brady Statistic AS VS Percent: 0.06 %
Brady Statistic RA Percent Paced: 0.6 %
Brady Statistic RV Percent Paced: 99.69 %
Date Time Interrogation Session: 20241227012310
HighPow Impedance: 68 Ohm
Implantable Lead Connection Status: 753985
Implantable Lead Connection Status: 753985
Implantable Lead Connection Status: 753985
Implantable Lead Implant Date: 20180328
Implantable Lead Implant Date: 20180328
Implantable Lead Implant Date: 20180328
Implantable Lead Location: 753858
Implantable Lead Location: 753859
Implantable Lead Location: 753860
Implantable Lead Model: 5076
Implantable Pulse Generator Implant Date: 20180328
Lead Channel Impedance Value: 194.634
Lead Channel Impedance Value: 203.256
Lead Channel Impedance Value: 218.298
Lead Channel Impedance Value: 224.438
Lead Channel Impedance Value: 235.98 Ohm
Lead Channel Impedance Value: 342 Ohm
Lead Channel Impedance Value: 342 Ohm
Lead Channel Impedance Value: 380 Ohm
Lead Channel Impedance Value: 399 Ohm
Lead Channel Impedance Value: 437 Ohm
Lead Channel Impedance Value: 437 Ohm
Lead Channel Impedance Value: 513 Ohm
Lead Channel Impedance Value: 627 Ohm
Lead Channel Impedance Value: 646 Ohm
Lead Channel Impedance Value: 646 Ohm
Lead Channel Impedance Value: 760 Ohm
Lead Channel Impedance Value: 836 Ohm
Lead Channel Impedance Value: 855 Ohm
Lead Channel Pacing Threshold Amplitude: 0.5 V
Lead Channel Pacing Threshold Amplitude: 0.5 V
Lead Channel Pacing Threshold Amplitude: 1.125 V
Lead Channel Pacing Threshold Pulse Width: 0.4 ms
Lead Channel Pacing Threshold Pulse Width: 0.4 ms
Lead Channel Pacing Threshold Pulse Width: 0.8 ms
Lead Channel Sensing Intrinsic Amplitude: 1.75 mV
Lead Channel Sensing Intrinsic Amplitude: 1.75 mV
Lead Channel Sensing Intrinsic Amplitude: 13.25 mV
Lead Channel Sensing Intrinsic Amplitude: 13.25 mV
Lead Channel Setting Pacing Amplitude: 1.5 V
Lead Channel Setting Pacing Amplitude: 1.75 V
Lead Channel Setting Pacing Amplitude: 2.5 V
Lead Channel Setting Pacing Pulse Width: 0.4 ms
Lead Channel Setting Pacing Pulse Width: 0.8 ms
Lead Channel Setting Sensing Sensitivity: 0.3 mV
Zone Setting Status: 755011
Zone Setting Status: 755011

## 2023-10-15 NOTE — Progress Notes (Signed)
 Remote ICD transmission.

## 2023-10-15 NOTE — Addendum Note (Signed)
Addended by: Elease Etienne A on: 10/15/2023 09:00 AM   Modules accepted: Orders

## 2023-12-13 ENCOUNTER — Ambulatory Visit: Payer: BC Managed Care – PPO

## 2023-12-13 DIAGNOSIS — I5022 Chronic systolic (congestive) heart failure: Secondary | ICD-10-CM

## 2023-12-13 DIAGNOSIS — I428 Other cardiomyopathies: Secondary | ICD-10-CM

## 2023-12-14 LAB — CUP PACEART REMOTE DEVICE CHECK
Battery Remaining Longevity: 12 mo
Battery Voltage: 2.87 V
Brady Statistic AP VP Percent: 0.48 %
Brady Statistic AP VS Percent: 0.01 %
Brady Statistic AS VP Percent: 99.45 %
Brady Statistic AS VS Percent: 0.05 %
Brady Statistic RA Percent Paced: 0.5 %
Brady Statistic RV Percent Paced: 99.83 %
Date Time Interrogation Session: 20250331002205
HighPow Impedance: 68 Ohm
Implantable Lead Connection Status: 753985
Implantable Lead Connection Status: 753985
Implantable Lead Connection Status: 753985
Implantable Lead Implant Date: 20180328
Implantable Lead Implant Date: 20180328
Implantable Lead Implant Date: 20180328
Implantable Lead Location: 753858
Implantable Lead Location: 753859
Implantable Lead Location: 753860
Implantable Lead Model: 5076
Implantable Pulse Generator Implant Date: 20180328
Lead Channel Impedance Value: 208.568
Lead Channel Impedance Value: 208.568
Lead Channel Impedance Value: 228 Ohm
Lead Channel Impedance Value: 228 Ohm
Lead Channel Impedance Value: 239.922
Lead Channel Impedance Value: 323 Ohm
Lead Channel Impedance Value: 399 Ohm
Lead Channel Impedance Value: 399 Ohm
Lead Channel Impedance Value: 399 Ohm
Lead Channel Impedance Value: 399 Ohm
Lead Channel Impedance Value: 437 Ohm
Lead Channel Impedance Value: 532 Ohm
Lead Channel Impedance Value: 646 Ohm
Lead Channel Impedance Value: 703 Ohm
Lead Channel Impedance Value: 703 Ohm
Lead Channel Impedance Value: 779 Ohm
Lead Channel Impedance Value: 817 Ohm
Lead Channel Impedance Value: 836 Ohm
Lead Channel Pacing Threshold Amplitude: 0.5 V
Lead Channel Pacing Threshold Amplitude: 0.5 V
Lead Channel Pacing Threshold Amplitude: 1.125 V
Lead Channel Pacing Threshold Pulse Width: 0.4 ms
Lead Channel Pacing Threshold Pulse Width: 0.4 ms
Lead Channel Pacing Threshold Pulse Width: 0.8 ms
Lead Channel Sensing Intrinsic Amplitude: 14.75 mV
Lead Channel Sensing Intrinsic Amplitude: 14.75 mV
Lead Channel Sensing Intrinsic Amplitude: 2 mV
Lead Channel Sensing Intrinsic Amplitude: 2 mV
Lead Channel Setting Pacing Amplitude: 1.5 V
Lead Channel Setting Pacing Amplitude: 1.75 V
Lead Channel Setting Pacing Amplitude: 2.5 V
Lead Channel Setting Pacing Pulse Width: 0.4 ms
Lead Channel Setting Pacing Pulse Width: 0.8 ms
Lead Channel Setting Sensing Sensitivity: 0.3 mV
Zone Setting Status: 755011
Zone Setting Status: 755011

## 2024-01-18 ENCOUNTER — Telehealth: Payer: Self-pay | Admitting: *Deleted

## 2024-01-18 NOTE — Progress Notes (Unsigned)
 Cardiology Office Note:  .   Date:  01/18/2024  ID:  Anthonette Bastos, DOB 03/27/1961, MRN [id number] PCP: Virgle Grime, MD  [facility] Providers Cardiologist:  Mickiel Albany, MD (Inactive) Electrophysiologist:  Richardo Chandler, MD {  History of Present Illness: .   JHALEN MCCOIN is a 63 y.o. male w/PMHx of  LBBB, NICM > CRT-D HTN  Last saw cards team, Dr. 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 Last TTE with recovered LVEF No changes were made  Saw Dr. Rodolfo Clan 09/02/22, mentioned again a chest discomfort.  Stable over the last 5 or 6 years.  Unrelated to exertion a pressure sensation in the middle of his chest without radiation without dyspnea.  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 No near syncope or syncope, no shocks   Device information MDT CRT-D, implanted 12/09/2016  He has a STM LV lead >>> NOT MRI conditional system   Studies Reviewed: Aaron Aas    EKG done today and reviewed by myself:  SB 52bpm, QRS  DEVICE interrogation done today and reviewed by myself Battery and lead measurements are good 99.8% effective BP 3 episodes labeled NSVT, 2 are 1:1 atrial driven, once true NSVT (noted historically as well)   Echocardiogram 2022: IMPRESSIONS   1. Left  ventricular ejection fraction, by estimation, is 50 to 55%. The  left ventricle has low normal function. The left ventricle has no regional  wall motion abnormalities. Left ventricular diastolic parameters were  normal.   2. Right ventricular systolic function is normal. The right ventricular  size is normal.   3. Left atrial size was mildly dilated.   4. The mitral valve is normal in structure. Trivial mitral valve  regurgitation. No evidence of mitral stenosis.   5. The aortic valve is tricuspid. Aortic valve regurgitation is trivial.  Mild aortic valve sclerosis is present, with no evidence of aortic valve  stenosis.   6. The inferior vena cava is normal in size with greater than 50%  respiratory variability, suggesting right atrial pressure of 3 mmHg.     Nuclear Stress Myocardial Perfusion Imaging 07/2017: Study Highlights    Nuclear stress EF: 38%. There was no ST segment deviation noted during stress. This is an intermediate risk study. The left ventricular ejection fraction is moderately decreased (30-44%).   1. EF 38%, diffuse hypokinesis. 2. Low counts noted generally at rest and with stress.  3. Fixed small mild apical perfusion defect. Cannot rule out prior infarction.  No ischemia.    Intermediate risk study due to low EF.         12/06/2016: TTE Study Conclusions - Left ventricle: The cavity size was normal. Wall thickness was   increased in a  pattern of mild LVH. Systolic function was   severely reduced. The estimated ejection fraction was in the   range of 20% to 25%. Diffuse hypokinesis. Doppler parameters are   consistent with abnormal left ventricular relaxation (grade 1   diastolic dysfunction). - Regional wall motion abnormality: Akinesis of the mid anterior   and basal-mid anteroseptal myocardium. - Aortic valve: Mildly calcified annulus. Trileaflet; normal   thickness leaflets. Valve area (VTI): 1.69 cm^2. Valve area   (Vmax): 1.93 cm^2. Valve area  (Vmean): 1.83 cm^2. - Left atrium: The atrium was mildly dilated. - Atrial septum: No defect or patent foramen ovale was identified. - Technically adequate study.   Risk Assessment/Calculations:    Physical Exam:   VS:  There were no vitals taken for this visit.   Wt Readings from Last 3 Encounters:  09/02/22 185 lb 3.2 oz (84 kg)  04/22/22 182 lb 3.2 oz (82.6 kg)  07/14/21 182 lb (82.6 kg)    GEN: Well nourished, well developed in no acute distress NECK: No JVD; No carotid bruits CARDIAC: RRR, no murmurs, rubs, gallops RESPIRATORY:  CTA b/l without rales, wheezing or rhonchi  ABDOMEN: Soft, non-tender, non-distended EXTREMITIES: No edema; No deformity   ICD site: is stable, no thinning, fluctuation, tethering  ASSESSMENT AND PLAN: .    1. CRT-D     intact function     no programming changes made   2. NICM 3. Chronic CHF (systolic)     Recovered LVEF by his echo 2022     No symptoms or exam findings of volume OL, OptiVol is ion the rise, exam does not support that     On BB/ACE tx     Over due to see gen cards, needs a new MD  4. Pre-op evaluation Orthopedic surgery considered low cardiac risk  Low cardiac risk score No cardiac contraindication to knee/orthopedic surgery planned No particular device management is needed with operations below waist He is likely going to postpone his surgery Asked that if when he rescheduled/the time comes, should he have developed any symptoms, change in clinical status to let us  know    Dispo: remotes as usual, in cl inic with EP in a year, sooner if needed.  He needs a new cardiologist, has no preference, will ask that he get scheduled with cards APP/new MD  Signed, Debbie Fails, PA-C

## 2024-01-18 NOTE — Telephone Encounter (Signed)
 Primary Cardiologist:Henry Arrie Bienenstock [name], MD (Inactive)  Chart reviewed as part of pre-operative protocol coverage. Because of Merwyn B Gracia's past medical history and time since last visit, he/she will require a follow-up visit in order to better assess preoperative cardiovascular risk.  Pre-op covering staff: - Please schedule appointment and call patient to inform them. - Please contact requesting surgeon's office via preferred method (i.e, phone, fax) to inform them of need for appointment prior to surgery.  Aspirin  is not managed by cardiology.  Gerldine Koch, NP-C  01/18/2024, 1:37 PM [address] [place], [place] [address] Office [phone] Fax [phone]

## 2024-01-18 NOTE — Telephone Encounter (Signed)
 I will send a message to EP schedulers to reach out to the pt with an appt. Pt last seen 2023.

## 2024-01-18 NOTE — Telephone Encounter (Signed)
 Pt has appt with Mertha Abrahams, [facility] 01/20/24 for preop clearance. I will update all parties involved.

## 2024-01-18 NOTE — Telephone Encounter (Signed)
   Pre-operative Risk Assessment    Patient Name: Jeremy Wagner  DOB: 11-12-1960 MRN: [id number]   Date of last office visit: 09/02/22 DR. KLEIN Date of next office visit: NONE   Request for Surgical Clearance    Procedure:  RIGHT QUAD TENDON REPAIR  Date of Surgery:  Clearance 01/24/24  (DATE PLANNED 5/12 IF HE CAN BE CLEARED)                              Surgeon:  DR. Carter Clare Surgeon's Group or Practice Name:  [facility] Phone number:  [phone] KERRI MAZE Fax number:  [phone]   Type of Clearance Requested:   - Medical  - Pharmacy:  Hold Aspirin      Type of Anesthesia:   CHOICE   Additional requests/questions:    Princeton Broom   01/18/2024, 1:09 PM

## 2024-01-18 NOTE — Telephone Encounter (Signed)
 Spoke w/ patient - he is scheduled to see Mertha Abrahams, PA on 5/8 to obtain clearance.

## 2024-01-20 ENCOUNTER — Encounter: Payer: Self-pay | Admitting: Physician Assistant

## 2024-01-20 ENCOUNTER — Ambulatory Visit: Attending: Physician Assistant | Admitting: Physician Assistant

## 2024-01-20 VITALS — BP 132/80 | HR 52 | Ht 71.0 in | Wt 193.0 lb

## 2024-01-20 DIAGNOSIS — I428 Other cardiomyopathies: Secondary | ICD-10-CM | POA: Diagnosis not present

## 2024-01-20 DIAGNOSIS — Z01818 Encounter for other preprocedural examination: Secondary | ICD-10-CM

## 2024-01-20 DIAGNOSIS — Z95810 Presence of automatic (implantable) cardiac defibrillator: Secondary | ICD-10-CM | POA: Diagnosis not present

## 2024-01-20 DIAGNOSIS — I5022 Chronic systolic (congestive) heart failure: Secondary | ICD-10-CM

## 2024-01-20 LAB — CUP PACEART INCLINIC DEVICE CHECK
Battery Remaining Longevity: 12 mo
Battery Voltage: 2.87 V
Brady Statistic AP VP Percent: 0.84 %
Brady Statistic AP VS Percent: 0.01 %
Brady Statistic AS VP Percent: 99.09 %
Brady Statistic AS VS Percent: 0.06 %
Brady Statistic RA Percent Paced: 0.85 %
Brady Statistic RV Percent Paced: 99.8 %
Date Time Interrogation Session: 20250508181501
HighPow Impedance: 77 Ohm
Implantable Lead Connection Status: 753985
Implantable Lead Connection Status: 753985
Implantable Lead Connection Status: 753985
Implantable Lead Implant Date: 20180328
Implantable Lead Implant Date: 20180328
Implantable Lead Implant Date: 20180328
Implantable Lead Location: 753858
Implantable Lead Location: 753859
Implantable Lead Location: 753860
Implantable Lead Model: 5076
Implantable Pulse Generator Implant Date: 20180328
Lead Channel Impedance Value: 190 Ohm
Lead Channel Impedance Value: 194.634
Lead Channel Impedance Value: 218.298
Lead Channel Impedance Value: 218.298
Lead Channel Impedance Value: 224.438
Lead Channel Impedance Value: 380 Ohm
Lead Channel Impedance Value: 380 Ohm
Lead Channel Impedance Value: 380 Ohm
Lead Channel Impedance Value: 380 Ohm
Lead Channel Impedance Value: 399 Ohm
Lead Channel Impedance Value: 399 Ohm
Lead Channel Impedance Value: 513 Ohm
Lead Channel Impedance Value: 627 Ohm
Lead Channel Impedance Value: 646 Ohm
Lead Channel Impedance Value: 665 Ohm
Lead Channel Impedance Value: 760 Ohm
Lead Channel Impedance Value: 760 Ohm
Lead Channel Impedance Value: 779 Ohm
Lead Channel Pacing Threshold Amplitude: 0.5 V
Lead Channel Pacing Threshold Amplitude: 0.5 V
Lead Channel Pacing Threshold Amplitude: 1 V
Lead Channel Pacing Threshold Pulse Width: 0.4 ms
Lead Channel Pacing Threshold Pulse Width: 0.4 ms
Lead Channel Pacing Threshold Pulse Width: 0.8 ms
Lead Channel Sensing Intrinsic Amplitude: 1.75 mV
Lead Channel Sensing Intrinsic Amplitude: 1.875 mV
Lead Channel Sensing Intrinsic Amplitude: 11.375 mV
Lead Channel Sensing Intrinsic Amplitude: 13.875 mV
Lead Channel Setting Pacing Amplitude: 1.5 V
Lead Channel Setting Pacing Amplitude: 1.5 V
Lead Channel Setting Pacing Amplitude: 2.5 V
Lead Channel Setting Pacing Pulse Width: 0.4 ms
Lead Channel Setting Pacing Pulse Width: 0.8 ms
Lead Channel Setting Sensing Sensitivity: 0.3 mV
Zone Setting Status: 755011
Zone Setting Status: 755011

## 2024-01-20 NOTE — Patient Instructions (Signed)
 Medication Instructions:   Your physician recommends that you continue on your current medications as directed. Please refer to the Current Medication list given to you today.   *If you need a refill on your cardiac medications before your next appointment, please call your pharmacy*   Lab Work: NONE ORDERED  TODAY    If you have labs (blood work) drawn today and your tests are completely normal, you will receive your results only by: MyChart Message (if you have MyChart) OR A paper copy in the mail If you have any lab test that is abnormal or we need to change your treatment, we will call you to review the results.    Testing/Procedures: NONE ORDERED  TODAY     Follow-Up: At [facility], you and your health needs are our priority.  As part of our continuing mission to provide you with exceptional heart care, our providers are all part of one team.  This team includes your primary Cardiologist (physician) and Advanced Practice Providers or APPs (Physician Assistants and Nurse Practitioners) who all work together to provide you with the care you need, when you need it.  Your next appointment:    ( DR Felipe Horton PATIENT )  WILL BE NEW PATIENT  NEEDS NEW GENERAL CARDIOLOGIST /APP  FIRST AVAILABLE OPENINIG.Jeremy Wagner   1 year(s)    Provider:     Mertha Abrahams, PA-C    We recommend signing up for the patient portal called "MyChart".  Sign up information is provided on this After Visit Summary.  MyChart is used to connect with patients for Virtual Visits (Telemedicine).  Patients are able to view lab/test results, encounter notes, upcoming appointments, etc.  Non-urgent messages can be sent to your provider as well.   To learn more about what you can do with MyChart, go to [email or web address].   Other Instructions

## 2024-01-27 NOTE — Addendum Note (Signed)
 Addended by: Edra Govern D on: 01/27/2024 10:28 AM   Modules accepted: Orders

## 2024-01-27 NOTE — Progress Notes (Signed)
 Remote ICD transmission.

## 2024-02-09 ENCOUNTER — Ambulatory Visit: Payer: Self-pay | Admitting: Cardiology

## 2024-03-14 ENCOUNTER — Ambulatory Visit (INDEPENDENT_AMBULATORY_CARE_PROVIDER_SITE_OTHER): Payer: BC Managed Care – PPO

## 2024-03-14 DIAGNOSIS — I428 Other cardiomyopathies: Secondary | ICD-10-CM

## 2024-03-14 LAB — CUP PACEART REMOTE DEVICE CHECK
Battery Remaining Longevity: 9 mo
Battery Voltage: 2.85 V
Brady Statistic AP VP Percent: 1.26 %
Brady Statistic AP VS Percent: 0.01 %
Brady Statistic AS VP Percent: 98.68 %
Brady Statistic AS VS Percent: 0.05 %
Brady Statistic RA Percent Paced: 1.27 %
Brady Statistic RV Percent Paced: 99.87 %
Date Time Interrogation Session: 20250701012305
HighPow Impedance: 71 Ohm
Implantable Lead Connection Status: 753985
Implantable Lead Connection Status: 753985
Implantable Lead Connection Status: 753985
Implantable Lead Implant Date: 20180328
Implantable Lead Implant Date: 20180328
Implantable Lead Implant Date: 20180328
Implantable Lead Location: 753858
Implantable Lead Location: 753859
Implantable Lead Location: 753860
Implantable Lead Model: 5076
Implantable Pulse Generator Implant Date: 20180328
Lead Channel Impedance Value: 194.634
Lead Channel Impedance Value: 194.634
Lead Channel Impedance Value: 214.783
Lead Channel Impedance Value: 220.723
Lead Channel Impedance Value: 220.723
Lead Channel Impedance Value: 323 Ohm
Lead Channel Impedance Value: 380 Ohm
Lead Channel Impedance Value: 380 Ohm
Lead Channel Impedance Value: 399 Ohm
Lead Channel Impedance Value: 399 Ohm
Lead Channel Impedance Value: 399 Ohm
Lead Channel Impedance Value: 494 Ohm
Lead Channel Impedance Value: 627 Ohm
Lead Channel Impedance Value: 646 Ohm
Lead Channel Impedance Value: 665 Ohm
Lead Channel Impedance Value: 703 Ohm
Lead Channel Impedance Value: 703 Ohm
Lead Channel Impedance Value: 722 Ohm
Lead Channel Pacing Threshold Amplitude: 0.5 V
Lead Channel Pacing Threshold Amplitude: 0.5 V
Lead Channel Pacing Threshold Amplitude: 1.125 V
Lead Channel Pacing Threshold Pulse Width: 0.4 ms
Lead Channel Pacing Threshold Pulse Width: 0.4 ms
Lead Channel Pacing Threshold Pulse Width: 0.8 ms
Lead Channel Sensing Intrinsic Amplitude: 1.875 mV
Lead Channel Sensing Intrinsic Amplitude: 1.875 mV
Lead Channel Sensing Intrinsic Amplitude: 11.625 mV
Lead Channel Sensing Intrinsic Amplitude: 11.625 mV
Lead Channel Setting Pacing Amplitude: 1.5 V
Lead Channel Setting Pacing Amplitude: 1.75 V
Lead Channel Setting Pacing Amplitude: 2.5 V
Lead Channel Setting Pacing Pulse Width: 0.4 ms
Lead Channel Setting Pacing Pulse Width: 0.8 ms
Lead Channel Setting Sensing Sensitivity: 0.3 mV
Zone Setting Status: 755011
Zone Setting Status: 755011

## 2024-03-15 ENCOUNTER — Ambulatory Visit: Payer: Self-pay | Admitting: Cardiology

## 2024-06-14 ENCOUNTER — Ambulatory Visit: Payer: BC Managed Care – PPO

## 2024-06-14 DIAGNOSIS — I428 Other cardiomyopathies: Secondary | ICD-10-CM

## 2024-06-15 LAB — CUP PACEART REMOTE DEVICE CHECK
Battery Remaining Longevity: 6 mo
Battery Voltage: 2.83 V
Brady Statistic AP VP Percent: 1.69 %
Brady Statistic AP VS Percent: 0.01 %
Brady Statistic AS VP Percent: 98.24 %
Brady Statistic AS VS Percent: 0.06 %
Brady Statistic RA Percent Paced: 1.7 %
Brady Statistic RV Percent Paced: 99.72 %
Date Time Interrogation Session: 20251001001804
HighPow Impedance: 72 Ohm
Implantable Lead Connection Status: 753985
Implantable Lead Connection Status: 753985
Implantable Lead Connection Status: 753985
Implantable Lead Implant Date: 20180328
Implantable Lead Implant Date: 20180328
Implantable Lead Implant Date: 20180328
Implantable Lead Location: 753858
Implantable Lead Location: 753859
Implantable Lead Location: 753860
Implantable Lead Model: 5076
Implantable Pulse Generator Implant Date: 20180328
Lead Channel Impedance Value: 190 Ohm
Lead Channel Impedance Value: 194.634
Lead Channel Impedance Value: 203.256
Lead Channel Impedance Value: 203.256
Lead Channel Impedance Value: 208.568
Lead Channel Impedance Value: 323 Ohm
Lead Channel Impedance Value: 380 Ohm
Lead Channel Impedance Value: 380 Ohm
Lead Channel Impedance Value: 399 Ohm
Lead Channel Impedance Value: 399 Ohm
Lead Channel Impedance Value: 437 Ohm
Lead Channel Impedance Value: 437 Ohm
Lead Channel Impedance Value: 589 Ohm
Lead Channel Impedance Value: 589 Ohm
Lead Channel Impedance Value: 646 Ohm
Lead Channel Impedance Value: 665 Ohm
Lead Channel Impedance Value: 703 Ohm
Lead Channel Impedance Value: 779 Ohm
Lead Channel Pacing Threshold Amplitude: 0.5 V
Lead Channel Pacing Threshold Amplitude: 0.5 V
Lead Channel Pacing Threshold Amplitude: 0.875 V
Lead Channel Pacing Threshold Pulse Width: 0.4 ms
Lead Channel Pacing Threshold Pulse Width: 0.4 ms
Lead Channel Pacing Threshold Pulse Width: 0.8 ms
Lead Channel Sensing Intrinsic Amplitude: 1.625 mV
Lead Channel Sensing Intrinsic Amplitude: 1.625 mV
Lead Channel Sensing Intrinsic Amplitude: 10.875 mV
Lead Channel Sensing Intrinsic Amplitude: 10.875 mV
Lead Channel Setting Pacing Amplitude: 1.5 V
Lead Channel Setting Pacing Amplitude: 1.5 V
Lead Channel Setting Pacing Amplitude: 2.5 V
Lead Channel Setting Pacing Pulse Width: 0.4 ms
Lead Channel Setting Pacing Pulse Width: 0.8 ms
Lead Channel Setting Sensing Sensitivity: 0.3 mV
Zone Setting Status: 755011
Zone Setting Status: 755011

## 2024-06-15 NOTE — Progress Notes (Signed)
 Remote ICD Transmission

## 2024-06-18 ENCOUNTER — Ambulatory Visit: Payer: Self-pay | Admitting: Cardiology

## 2024-06-19 NOTE — Progress Notes (Signed)
 Remote ICD Transmission

## 2024-07-17 ENCOUNTER — Ambulatory Visit: Attending: Cardiology

## 2024-07-17 ENCOUNTER — Encounter

## 2024-07-19 LAB — CUP PACEART REMOTE DEVICE CHECK
Battery Remaining Longevity: 6 mo
Battery Voltage: 2.81 V
Brady Statistic AP VP Percent: 1.58 %
Brady Statistic AP VS Percent: 0.01 %
Brady Statistic AS VP Percent: 98.35 %
Brady Statistic AS VS Percent: 0.06 %
Brady Statistic RA Percent Paced: 1.59 %
Brady Statistic RV Percent Paced: 99.66 %
Date Time Interrogation Session: 20251105012209
HighPow Impedance: 68 Ohm
Implantable Lead Connection Status: 753985
Implantable Lead Connection Status: 753985
Implantable Lead Connection Status: 753985
Implantable Lead Implant Date: 20180328
Implantable Lead Implant Date: 20180328
Implantable Lead Implant Date: 20180328
Implantable Lead Location: 753858
Implantable Lead Location: 753859
Implantable Lead Location: 753860
Implantable Lead Model: 5076
Implantable Pulse Generator Implant Date: 20180328
Lead Channel Impedance Value: 194.634
Lead Channel Impedance Value: 199.5 Ohm
Lead Channel Impedance Value: 214.783
Lead Channel Impedance Value: 220.723
Lead Channel Impedance Value: 220.723
Lead Channel Impedance Value: 323 Ohm
Lead Channel Impedance Value: 342 Ohm
Lead Channel Impedance Value: 342 Ohm
Lead Channel Impedance Value: 380 Ohm
Lead Channel Impedance Value: 399 Ohm
Lead Channel Impedance Value: 399 Ohm
Lead Channel Impedance Value: 494 Ohm
Lead Channel Impedance Value: 627 Ohm
Lead Channel Impedance Value: 665 Ohm
Lead Channel Impedance Value: 665 Ohm
Lead Channel Impedance Value: 722 Ohm
Lead Channel Impedance Value: 722 Ohm
Lead Channel Impedance Value: 779 Ohm
Lead Channel Pacing Threshold Amplitude: 0.5 V
Lead Channel Pacing Threshold Amplitude: 0.625 V
Lead Channel Pacing Threshold Amplitude: 0.875 V
Lead Channel Pacing Threshold Pulse Width: 0.4 ms
Lead Channel Pacing Threshold Pulse Width: 0.4 ms
Lead Channel Pacing Threshold Pulse Width: 0.8 ms
Lead Channel Sensing Intrinsic Amplitude: 14.625 mV
Lead Channel Sensing Intrinsic Amplitude: 14.625 mV
Lead Channel Sensing Intrinsic Amplitude: 2 mV
Lead Channel Sensing Intrinsic Amplitude: 2 mV
Lead Channel Setting Pacing Amplitude: 1.5 V
Lead Channel Setting Pacing Amplitude: 1.5 V
Lead Channel Setting Pacing Amplitude: 2.5 V
Lead Channel Setting Pacing Pulse Width: 0.4 ms
Lead Channel Setting Pacing Pulse Width: 0.8 ms
Lead Channel Setting Sensing Sensitivity: 0.3 mV
Zone Setting Status: 755011
Zone Setting Status: 755011

## 2024-07-22 ENCOUNTER — Ambulatory Visit: Payer: Self-pay | Admitting: Cardiology

## 2024-08-17 ENCOUNTER — Encounter

## 2024-08-17 ENCOUNTER — Ambulatory Visit: Attending: Cardiology

## 2024-08-17 DIAGNOSIS — I428 Other cardiomyopathies: Secondary | ICD-10-CM

## 2024-08-17 LAB — CUP PACEART REMOTE DEVICE CHECK
Battery Remaining Longevity: 4 mo
Battery Voltage: 2.8 V
Brady Statistic AP VP Percent: 0.4 %
Brady Statistic AP VS Percent: 0.01 %
Brady Statistic AS VP Percent: 99.43 %
Brady Statistic AS VS Percent: 0.16 %
Brady Statistic RA Percent Paced: 0.41 %
Brady Statistic RV Percent Paced: 99.41 %
Date Time Interrogation Session: 20251204022825
HighPow Impedance: 71 Ohm
Implantable Lead Connection Status: 753985
Implantable Lead Connection Status: 753985
Implantable Lead Connection Status: 753985
Implantable Lead Implant Date: 20180328
Implantable Lead Implant Date: 20180328
Implantable Lead Implant Date: 20180328
Implantable Lead Location: 753858
Implantable Lead Location: 753859
Implantable Lead Location: 753860
Implantable Lead Model: 5076
Implantable Pulse Generator Implant Date: 20180328
Lead Channel Impedance Value: 184.154
Lead Channel Impedance Value: 191.854
Lead Channel Impedance Value: 202.091
Lead Channel Impedance Value: 220.723
Lead Channel Impedance Value: 231.878
Lead Channel Impedance Value: 342 Ohm
Lead Channel Impedance Value: 342 Ohm
Lead Channel Impedance Value: 342 Ohm
Lead Channel Impedance Value: 399 Ohm
Lead Channel Impedance Value: 437 Ohm
Lead Channel Impedance Value: 437 Ohm
Lead Channel Impedance Value: 494 Ohm
Lead Channel Impedance Value: 589 Ohm
Lead Channel Impedance Value: 665 Ohm
Lead Channel Impedance Value: 665 Ohm
Lead Channel Impedance Value: 760 Ohm
Lead Channel Impedance Value: 779 Ohm
Lead Channel Impedance Value: 779 Ohm
Lead Channel Pacing Threshold Amplitude: 0.5 V
Lead Channel Pacing Threshold Amplitude: 0.625 V
Lead Channel Pacing Threshold Amplitude: 1 V
Lead Channel Pacing Threshold Pulse Width: 0.4 ms
Lead Channel Pacing Threshold Pulse Width: 0.4 ms
Lead Channel Pacing Threshold Pulse Width: 0.8 ms
Lead Channel Sensing Intrinsic Amplitude: 1.75 mV
Lead Channel Sensing Intrinsic Amplitude: 1.75 mV
Lead Channel Sensing Intrinsic Amplitude: 14.125 mV
Lead Channel Sensing Intrinsic Amplitude: 14.125 mV
Lead Channel Setting Pacing Amplitude: 1.5 V
Lead Channel Setting Pacing Amplitude: 1.5 V
Lead Channel Setting Pacing Amplitude: 2.5 V
Lead Channel Setting Pacing Pulse Width: 0.4 ms
Lead Channel Setting Pacing Pulse Width: 0.8 ms
Lead Channel Setting Sensing Sensitivity: 0.3 mV
Zone Setting Status: 755011
Zone Setting Status: 755011

## 2024-08-21 NOTE — Progress Notes (Signed)
 Remote ICD transmission.

## 2024-09-10 ENCOUNTER — Ambulatory Visit: Payer: Self-pay | Admitting: Cardiology

## 2024-09-13 ENCOUNTER — Encounter

## 2024-09-17 ENCOUNTER — Ambulatory Visit: Attending: Cardiology

## 2024-09-17 DIAGNOSIS — I428 Other cardiomyopathies: Secondary | ICD-10-CM

## 2024-09-18 ENCOUNTER — Encounter

## 2024-09-18 LAB — CUP PACEART REMOTE DEVICE CHECK
Battery Remaining Longevity: 4 mo
Battery Voltage: 2.79 V
Brady Statistic AP VP Percent: 0.38 %
Brady Statistic AP VS Percent: 0.01 %
Brady Statistic AS VP Percent: 99.52 %
Brady Statistic AS VS Percent: 0.1 %
Brady Statistic RA Percent Paced: 0.39 %
Brady Statistic RV Percent Paced: 99.44 %
Date Time Interrogation Session: 20260104012405
HighPow Impedance: 73 Ohm
Implantable Lead Connection Status: 753985
Implantable Lead Connection Status: 753985
Implantable Lead Connection Status: 753985
Implantable Lead Implant Date: 20180328
Implantable Lead Implant Date: 20180328
Implantable Lead Implant Date: 20180328
Implantable Lead Location: 753858
Implantable Lead Location: 753859
Implantable Lead Location: 753860
Implantable Lead Model: 5076
Implantable Pulse Generator Implant Date: 20180328
Lead Channel Impedance Value: 190 Ohm
Lead Channel Impedance Value: 194.634
Lead Channel Impedance Value: 221.667
Lead Channel Impedance Value: 221.667
Lead Channel Impedance Value: 228 Ohm
Lead Channel Impedance Value: 342 Ohm
Lead Channel Impedance Value: 380 Ohm
Lead Channel Impedance Value: 380 Ohm
Lead Channel Impedance Value: 399 Ohm
Lead Channel Impedance Value: 437 Ohm
Lead Channel Impedance Value: 494 Ohm
Lead Channel Impedance Value: 532 Ohm
Lead Channel Impedance Value: 646 Ohm
Lead Channel Impedance Value: 646 Ohm
Lead Channel Impedance Value: 646 Ohm
Lead Channel Impedance Value: 779 Ohm
Lead Channel Impedance Value: 779 Ohm
Lead Channel Impedance Value: 817 Ohm
Lead Channel Pacing Threshold Amplitude: 0.5 V
Lead Channel Pacing Threshold Amplitude: 0.625 V
Lead Channel Pacing Threshold Amplitude: 1 V
Lead Channel Pacing Threshold Pulse Width: 0.4 ms
Lead Channel Pacing Threshold Pulse Width: 0.4 ms
Lead Channel Pacing Threshold Pulse Width: 0.8 ms
Lead Channel Sensing Intrinsic Amplitude: 1.75 mV
Lead Channel Sensing Intrinsic Amplitude: 1.75 mV
Lead Channel Sensing Intrinsic Amplitude: 15.5 mV
Lead Channel Sensing Intrinsic Amplitude: 15.5 mV
Lead Channel Setting Pacing Amplitude: 1.5 V
Lead Channel Setting Pacing Amplitude: 1.5 V
Lead Channel Setting Pacing Amplitude: 2.5 V
Lead Channel Setting Pacing Pulse Width: 0.4 ms
Lead Channel Setting Pacing Pulse Width: 0.8 ms
Lead Channel Setting Sensing Sensitivity: 0.3 mV
Zone Setting Status: 755011
Zone Setting Status: 755011

## 2024-09-21 NOTE — Progress Notes (Signed)
 Remote ICD Transmission

## 2024-09-23 ENCOUNTER — Ambulatory Visit: Payer: Self-pay | Admitting: Cardiology

## 2024-10-18 ENCOUNTER — Ambulatory Visit

## 2024-10-18 LAB — CUP PACEART REMOTE DEVICE CHECK
Battery Remaining Longevity: 3 mo
Battery Voltage: 2.77 V
Brady Statistic AP VP Percent: 0.74 %
Brady Statistic AP VS Percent: 0.01 %
Brady Statistic AS VP Percent: 99.16 %
Brady Statistic AS VS Percent: 0.09 %
Brady Statistic RA Percent Paced: 0.75 %
Brady Statistic RV Percent Paced: 99.39 %
Date Time Interrogation Session: 20260204012403
HighPow Impedance: 68 Ohm
Implantable Lead Connection Status: 753985
Implantable Lead Connection Status: 753985
Implantable Lead Connection Status: 753985
Implantable Lead Implant Date: 20180328
Implantable Lead Implant Date: 20180328
Implantable Lead Implant Date: 20180328
Implantable Lead Location: 753858
Implantable Lead Location: 753859
Implantable Lead Location: 753860
Implantable Lead Model: 5076
Implantable Pulse Generator Implant Date: 20180328
Lead Channel Impedance Value: 194.634
Lead Channel Impedance Value: 199.5 Ohm
Lead Channel Impedance Value: 207.273
Lead Channel Impedance Value: 212.8 Ohm
Lead Channel Impedance Value: 212.8 Ohm
Lead Channel Impedance Value: 380 Ohm
Lead Channel Impedance Value: 380 Ohm
Lead Channel Impedance Value: 380 Ohm
Lead Channel Impedance Value: 399 Ohm
Lead Channel Impedance Value: 399 Ohm
Lead Channel Impedance Value: 437 Ohm
Lead Channel Impedance Value: 456 Ohm
Lead Channel Impedance Value: 627 Ohm
Lead Channel Impedance Value: 646 Ohm
Lead Channel Impedance Value: 665 Ohm
Lead Channel Impedance Value: 722 Ohm
Lead Channel Impedance Value: 722 Ohm
Lead Channel Impedance Value: 722 Ohm
Lead Channel Pacing Threshold Amplitude: 0.5 V
Lead Channel Pacing Threshold Amplitude: 0.625 V
Lead Channel Pacing Threshold Amplitude: 1 V
Lead Channel Pacing Threshold Pulse Width: 0.4 ms
Lead Channel Pacing Threshold Pulse Width: 0.4 ms
Lead Channel Pacing Threshold Pulse Width: 0.8 ms
Lead Channel Sensing Intrinsic Amplitude: 1.625 mV
Lead Channel Sensing Intrinsic Amplitude: 1.625 mV
Lead Channel Sensing Intrinsic Amplitude: 6.75 mV
Lead Channel Sensing Intrinsic Amplitude: 6.75 mV
Lead Channel Setting Pacing Amplitude: 1.5 V
Lead Channel Setting Pacing Amplitude: 1.5 V
Lead Channel Setting Pacing Amplitude: 2.5 V
Lead Channel Setting Pacing Pulse Width: 0.4 ms
Lead Channel Setting Pacing Pulse Width: 0.8 ms
Lead Channel Setting Sensing Sensitivity: 0.3 mV
Zone Setting Status: 755011
Zone Setting Status: 755011

## 2024-10-19 ENCOUNTER — Encounter

## 2024-11-18 ENCOUNTER — Ambulatory Visit

## 2024-11-20 ENCOUNTER — Encounter

## 2024-12-13 ENCOUNTER — Encounter

## 2024-12-19 ENCOUNTER — Ambulatory Visit

## 2024-12-21 ENCOUNTER — Encounter

## 2025-03-14 ENCOUNTER — Encounter

## 2025-06-13 ENCOUNTER — Encounter

## 2025-09-12 ENCOUNTER — Encounter

## 2025-12-12 ENCOUNTER — Encounter
